# Patient Record
Sex: Male | Born: 1975 | Race: White | Hispanic: No | Marital: Married | Smoking: Current every day smoker
Health system: Southern US, Community
[De-identification: ages and names within clinical notes are randomized; demographics above are authoritative.]

## PROBLEM LIST (undated history)

## (undated) DIAGNOSIS — I10 Essential (primary) hypertension: Secondary | ICD-10-CM

---

## 1998-03-02 ENCOUNTER — Emergency Department (HOSPITAL_COMMUNITY): Admission: EM | Admit: 1998-03-02 | Discharge: 1998-03-02 | Payer: Self-pay | Admitting: Emergency Medicine

## 2002-06-26 ENCOUNTER — Emergency Department (HOSPITAL_COMMUNITY): Admission: EM | Admit: 2002-06-26 | Discharge: 2002-06-26 | Payer: Self-pay | Admitting: Emergency Medicine

## 2002-06-26 ENCOUNTER — Encounter: Payer: Self-pay | Admitting: Emergency Medicine

## 2004-09-13 ENCOUNTER — Emergency Department (HOSPITAL_COMMUNITY): Admission: EM | Admit: 2004-09-13 | Discharge: 2004-09-13 | Payer: Self-pay | Admitting: Emergency Medicine

## 2005-01-19 ENCOUNTER — Emergency Department (HOSPITAL_COMMUNITY): Admission: EM | Admit: 2005-01-19 | Discharge: 2005-01-19 | Payer: Self-pay | Admitting: Emergency Medicine

## 2008-09-02 ENCOUNTER — Ambulatory Visit: Payer: Self-pay | Admitting: Internal Medicine

## 2009-08-05 ENCOUNTER — Ambulatory Visit: Payer: Self-pay | Admitting: Internal Medicine

## 2010-01-16 ENCOUNTER — Ambulatory Visit: Payer: Self-pay | Admitting: Internal Medicine

## 2010-03-19 ENCOUNTER — Ambulatory Visit: Payer: Self-pay | Admitting: Internal Medicine

## 2010-07-24 ENCOUNTER — Ambulatory Visit
Admission: RE | Admit: 2010-07-24 | Discharge: 2010-07-24 | Payer: Self-pay | Source: Home / Self Care | Attending: Internal Medicine | Admitting: Internal Medicine

## 2011-02-01 ENCOUNTER — Telehealth: Payer: Self-pay | Admitting: Internal Medicine

## 2011-02-01 NOTE — Telephone Encounter (Signed)
Called pt and advised him to get an appt with [facility].  Pt voiced understanding.

## 2011-02-01 NOTE — Telephone Encounter (Signed)
Pt should get appt with [facility].

## 2011-06-21 ENCOUNTER — Ambulatory Visit (INDEPENDENT_AMBULATORY_CARE_PROVIDER_SITE_OTHER): Payer: BC Managed Care – PPO | Admitting: Internal Medicine

## 2011-06-21 ENCOUNTER — Encounter: Payer: Self-pay | Admitting: Internal Medicine

## 2011-06-21 DIAGNOSIS — I10 Essential (primary) hypertension: Secondary | ICD-10-CM

## 2011-06-21 DIAGNOSIS — K5289 Other specified noninfective gastroenteritis and colitis: Secondary | ICD-10-CM

## 2011-06-21 DIAGNOSIS — K529 Noninfective gastroenteritis and colitis, unspecified: Secondary | ICD-10-CM

## 2011-06-21 DIAGNOSIS — F411 Generalized anxiety disorder: Secondary | ICD-10-CM

## 2011-06-21 DIAGNOSIS — F419 Anxiety disorder, unspecified: Secondary | ICD-10-CM | POA: Insufficient documentation

## 2011-06-21 NOTE — Progress Notes (Signed)
  Subjective:    Patient ID: Barry Beltran, male    DOB: 1975/10/20, 35 y.o.   MRN: [id number]  HPI patient awakened yesterday around 5 AM with vomiting and diarrhea. Had 8 episodes of vomiting and about 14 episodes of diarrhea. No blood in stool. Today's had about 6 episodes of diarrhea. Denies myalgias. No fever or shaking chills. Has newborn infant at home in daycare who came home with fever and vomiting with respiratory congestion. Patient has no respiratory congestion symptoms. History of anxiety. Marriage is under fair amount of stress. Wife is unhappy about going back to work bleeding baby in daycare wants to quit her job. He says he cannot afford to do that financially. Blood pressure significantly elevated today at 150/120. Usually when he seen in the doctor's office diastolic is around 100. History of labile hypertension. Family history of hypertension in both parents. Admits to being under a lot of financial stress. He got his old job back recently about 6 months ago. Wife now wants him to find another job and wants a new house. They have had a frank discussion about how he does not feel that this is feasible. Also has a 22 year old son from a previous relationship that has been acting out with a new baby at home. He's done poorly in school. He wanted to know where his real mother wants. It had a frank discussion with him about mother being on cocaine and being bipolar. They think she is living in [place]. No complaint of sore throat.    Review of Systems     Objective:   Physical Exam lethargic white male. HEENT exam pharynx is injected without exudate. TMs are clear; neck is supple without significant adenopathy; chest clear; abdomen bowel sounds are active abdomen is nondistended no hepatosplenomegaly masses or significant tenderness        Assessment & Plan:  Viral gastroenteritis  Anxiety  Hypertension  Plan: Start Norvasc 5 mg daily and recheck blood pressure in 2 weeks;  Phenergan 25 mg Prevacid #30 (tablets 1 by mouth every 4 hours when necessary nausea with no refill. Clear liquids for 48 hours or so until the vomiting and diarrhea have resolved and then advance diet slowly. Xanax 0.5 mg which was previously prescribed for anxiety #60 one by mouth twice daily when necessary anxiety with 1 refill

## 2011-06-21 NOTE — Patient Instructions (Signed)
Take Xanax 0.5 mg twice daily as needed for anxiety. Take Phenergan 25 mg tablets every 4-6 hours as needed for nausea. Start Norvasc 5 mg daily for hypertension. Followup with blood pressure check in 2 weeks here. Stay on clear liquids for 24-48 hours and advance diet slowly. Out of work today and tomorrow note provided

## 2011-07-08 ENCOUNTER — Encounter: Payer: Self-pay | Admitting: Internal Medicine

## 2011-07-08 ENCOUNTER — Ambulatory Visit (INDEPENDENT_AMBULATORY_CARE_PROVIDER_SITE_OTHER): Payer: BC Managed Care – PPO | Admitting: Internal Medicine

## 2011-07-08 VITALS — BP 142/92 | HR 96 | Temp 97.9°F | Wt 213.0 lb

## 2011-07-08 DIAGNOSIS — Z23 Encounter for immunization: Secondary | ICD-10-CM

## 2011-07-08 DIAGNOSIS — I10 Essential (primary) hypertension: Secondary | ICD-10-CM

## 2011-07-08 DIAGNOSIS — F411 Generalized anxiety disorder: Secondary | ICD-10-CM

## 2011-07-08 DIAGNOSIS — M79659 Pain in unspecified thigh: Secondary | ICD-10-CM

## 2011-07-08 DIAGNOSIS — F419 Anxiety disorder, unspecified: Secondary | ICD-10-CM

## 2011-07-08 DIAGNOSIS — M79609 Pain in unspecified limb: Secondary | ICD-10-CM

## 2011-07-08 DIAGNOSIS — M79606 Pain in leg, unspecified: Secondary | ICD-10-CM

## 2011-07-08 MED ORDER — TETANUS-DIPHTH-ACELL PERTUSSIS 5-2.5-18.5 LF-MCG/0.5 IM SUSP: 0.5000 mL | Freq: Once | INTRAMUSCULAR | Status: AC

## 2011-07-08 NOTE — Patient Instructions (Signed)
Take ibuprofen 800 mg 3 times daily with food for 2 weeks. Ice leg down for 20 minutes daily. Start Cozaar 50 mg daily in addition to amlodipine 5 mg daily. You have been given immunization today for influenza. Continue to take Xanax sparingly for anxiety.

## 2011-07-08 NOTE — Progress Notes (Signed)
  Subjective:    Patient ID: Barry Beltran, male    DOB: 1975/11/17, 35 y.o.   MRN: [id number]  HPI in today to followup on hypertension. Currently on amlodipine 5 mg daily and tolerating it without any side effects. Blood pressure has improved but is not optimal. Also he slipped yesterday on a wet deck and strained his left 5. Having considerable pain in left upper medial thigh. Xanax has helped anxiety but he has to take 2 of the 0.5 mg tablets to have any affect. Says he's been taking Xanax sparingly over the holidays. 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    Review of Systems     Objective:   Physical Exam left medial thigh tender to deep palpation. No evidence of bruising. No hernia to direct palpation. Chest clear; cardiac exam regular rate and rhythm        Assessment & Plan:  Anxiety  Hypertension  Musculoskeletal pain/strain left thigh  Plan: Add Cozaar 50 mg daily to amlodipine 5 mg daily and recheck in 4 weeks with office visit in be met. Influenza and Tdap vaccines given today

## 2011-07-09 ENCOUNTER — Telehealth: Payer: Self-pay

## 2011-07-09 NOTE — Telephone Encounter (Signed)
This is only a strain. Has not been on Ibuprofen long enough to relieve the pain. Continue Ibuprofen as prescribed yesterday. Ice down for 20 minutes daily. It may take 10 days to improve.

## 2011-07-09 NOTE — Telephone Encounter (Signed)
Patient informed. 

## 2011-07-09 NOTE — Telephone Encounter (Signed)
Ibuprofen 800 mg not touching pain from groin pull.

## 2011-08-03 ENCOUNTER — Encounter: Payer: Self-pay | Admitting: Internal Medicine

## 2011-08-05 ENCOUNTER — Ambulatory Visit: Payer: BC Managed Care – PPO | Admitting: Internal Medicine

## 2011-08-18 ENCOUNTER — Other Ambulatory Visit: Payer: Self-pay | Admitting: Internal Medicine

## 2011-08-31 ENCOUNTER — Other Ambulatory Visit: Payer: Self-pay | Admitting: Internal Medicine

## 2011-09-30 ENCOUNTER — Other Ambulatory Visit: Payer: Self-pay

## 2011-09-30 DIAGNOSIS — F419 Anxiety disorder, unspecified: Secondary | ICD-10-CM

## 2011-09-30 MED ORDER — ALPRAZOLAM 0.5 MG PO TABS: 0.5000 mg | ORAL_TABLET | Freq: Two times a day (BID) | ORAL | Status: DC

## 2011-11-06 ENCOUNTER — Other Ambulatory Visit: Payer: Self-pay | Admitting: Internal Medicine

## 2011-12-07 ENCOUNTER — Encounter (HOSPITAL_COMMUNITY): Payer: Self-pay | Admitting: Emergency Medicine

## 2011-12-07 ENCOUNTER — Emergency Department (HOSPITAL_COMMUNITY): Payer: BC Managed Care – PPO

## 2011-12-07 ENCOUNTER — Emergency Department (HOSPITAL_COMMUNITY)
Admission: EM | Admit: 2011-12-07 | Discharge: 2011-12-07 | Disposition: A | Discharge status: Home / Self Care | Payer: BC Managed Care – PPO | Attending: Emergency Medicine | Admitting: Emergency Medicine

## 2011-12-07 DIAGNOSIS — S61509A Unspecified open wound of unspecified wrist, initial encounter: Secondary | ICD-10-CM | POA: Insufficient documentation

## 2011-12-07 DIAGNOSIS — W01119A Fall on same level from slipping, tripping and stumbling with subsequent striking against unspecified sharp object, initial encounter: Secondary | ICD-10-CM | POA: Insufficient documentation

## 2011-12-07 DIAGNOSIS — I10 Essential (primary) hypertension: Secondary | ICD-10-CM | POA: Insufficient documentation

## 2011-12-07 DIAGNOSIS — S63509A Unspecified sprain of unspecified wrist, initial encounter: Secondary | ICD-10-CM | POA: Insufficient documentation

## 2011-12-07 DIAGNOSIS — W268XXA Contact with other sharp object(s), not elsewhere classified, initial encounter: Secondary | ICD-10-CM | POA: Insufficient documentation

## 2011-12-07 DIAGNOSIS — S61519A Laceration without foreign body of unspecified wrist, initial encounter: Secondary | ICD-10-CM

## 2011-12-07 DIAGNOSIS — Y92009 Unspecified place in unspecified non-institutional (private) residence as the place of occurrence of the external cause: Secondary | ICD-10-CM | POA: Insufficient documentation

## 2011-12-07 HISTORY — DX: Essential (primary) hypertension: I10

## 2011-12-07 MED ORDER — OXYCODONE-ACETAMINOPHEN 5-325 MG PO TABS: 1.0000 | ORAL_TABLET | Freq: Four times a day (QID) | ORAL | Status: AC | PRN

## 2011-12-07 MED ORDER — KETOROLAC TROMETHAMINE 30 MG/ML IJ SOLN
30.0000 mg | Freq: Once | INTRAMUSCULAR | Status: AC
  Administered 2011-12-07: 30 mg via INTRAMUSCULAR
  Filled 2011-12-07: qty 1

## 2011-12-07 NOTE — ED Notes (Signed)
Pt presents with c/o wrist laceration. Fell at approximately 1045 tonight and scraped arm on metal screws. Pt sts wrist extremely painful. Can wiggle all digits, thumb and pinky hurt when trying to move. Wrist is extremely tender to touch. Laceration continues to bleed.

## 2011-12-07 NOTE — Discharge Instructions (Signed)
You were seen and evaluated for your left wrist injuries and laceration. X-rays show any broken bones. Your laceration was cleaned and repaired with sutures will need to be removed in 10 days. You may followup with your primary care provider, urgent care Center or return to the emergency room to have your sutures removed. Please watch for any signs of redness to the skin, increasing swelling, increasing pain, pus or discharge, fever or chills. If you develop any of these symptoms return to the emergency room immediately. Your providers also feel you have a sprained wrist. You have been placed in a splint to help keep your hand from moving and rest. He should continue to use rest, ice, compression and elevation to reduce pain and swelling.   Laceration Care, Adult A laceration is a cut or lesion that goes through all layers of the skin and into the tissue just beneath the skin. TREATMENT  Some lacerations may not require closure. Some lacerations may not be able to be closed due to an increased risk of infection. It is important to see your caregiver as soon as possible after an injury to minimize the risk of infection and maximize the opportunity for successful closure. If closure is appropriate, pain medicines may be given, if needed. The wound will be cleaned to help prevent infection. Your caregiver will use stitches (sutures), staples, wound glue (adhesive), or skin adhesive strips to repair the laceration. These tools bring the skin edges together to allow for faster healing and a better cosmetic outcome. However, all wounds will heal with a scar. Once the wound has healed, scarring can be minimized by covering the wound with sunscreen during the day for 1 full year. HOME CARE INSTRUCTIONS  For sutures or staples:  Keep the wound clean and dry.   If you were given a bandage (dressing), you should change it at least once a day. Also, change the dressing if it becomes wet or dirty, or as directed by  your caregiver.   Wash the wound with soap and water 2 times a day. Rinse the wound off with water to remove all soap. Pat the wound dry with a clean towel.   After cleaning, apply a thin layer of the antibiotic ointment as recommended by your caregiver. This will help prevent infection and keep the dressing from sticking.   You may shower as usual after the first 24 hours. Do not soak the wound in water until the sutures are removed.   Only take over-the-counter or prescription medicines for pain, discomfort, or fever as directed by your caregiver.   Get your sutures or staples removed as directed by your caregiver.  For skin adhesive strips:  Keep the wound clean and dry.   Do not get the skin adhesive strips wet. You may bathe carefully, using caution to keep the wound dry.   If the wound gets wet, pat it dry with a clean towel.   Skin adhesive strips will fall off on their own. You may trim the strips as the wound heals. Do not remove skin adhesive strips that are still stuck to the wound. They will fall off in time.  For wound adhesive:  You may briefly wet your wound in the shower or bath. Do not soak or scrub the wound. Do not swim. Avoid periods of heavy perspiration until the skin adhesive has fallen off on its own. After showering or bathing, gently pat the wound dry with a clean towel.   Do not  apply liquid medicine, cream medicine, or ointment medicine to your wound while the skin adhesive is in place. This may loosen the film before your wound is healed.   If a dressing is placed over the wound, be careful not to apply tape directly over the skin adhesive. This may cause the adhesive to be pulled off before the wound is healed.   Avoid prolonged exposure to sunlight or tanning lamps while the skin adhesive is in place. Exposure to ultraviolet light in the first year will darken the scar.   The skin adhesive will usually remain in place for 5 to 10 days, then naturally fall  off the skin. Do not pick at the adhesive film.  You may need a tetanus shot if:  You cannot remember when you had your last tetanus shot.   You have never had a tetanus shot.  If you get a tetanus shot, your arm may swell, get red, and feel warm to the touch. This is common and not a problem. If you need a tetanus shot and you choose not to have one, there is a rare chance of getting tetanus. Sickness from tetanus can be serious. SEEK MEDICAL CARE IF:   You have redness, swelling, or increasing pain in the wound.   You see a red line that goes away from the wound.   You have yellowish-white fluid (pus) coming from the wound.   You have a fever.   You notice a bad smell coming from the wound or dressing.   Your wound breaks open before or after sutures have been removed.   You notice something coming out of the wound such as wood or glass.   Your wound is on your hand or foot and you cannot move a finger or toe.  SEEK IMMEDIATE MEDICAL CARE IF:   Your pain is not controlled with prescribed medicine.   You have severe swelling around the wound causing pain and numbness or a change in color in your arm, hand, leg, or foot.   Your wound splits open and starts bleeding.   You have worsening numbness, weakness, or loss of function of any joint around or beyond the wound.   You develop painful lumps near the wound or on the skin anywhere on your body.  MAKE SURE YOU:   Understand these instructions.   Will watch your condition.   Will get help right away if you are not doing well or get worse.  Document Released: 06/28/2005 Document Revised: 06/17/2011 Document Reviewed: 12/22/2010 [facility] Patient Information 2012 [place], [place].      Sprains Sprains are painful injuries to joints as a result of partial or complete tearing of ligaments. HOME CARE INSTRUCTIONS   For the first 24 hours, keep the injured limb raised on 2 pillows while lying down.   Apply ice bags about  every 2 hours for 20 to 30 minutes, while awake, to the injured area for the first 24 hours. Then apply as directed by your caregiver. Place the ice in a plastic bag with a towel around it to prevent frostbite to the skin.   Only take over-the-counter or prescription medicines for pain, discomfort, or fever as directed by your caregiver.   If an ace bandage (a stretchy, elastic wrapping bandage) has been applied today, remove and reapply every 3 to 4 hours. Apply firm enough to keep swelling down. Donot apply tightly. Watch fingers or toes for swelling, bluish discoloration, coldness, numbness, or excessive pain. If any  of these problems (symptoms) occur, remove the ace bandage and reapply it more loosely. Contact your caregiver or return to this location if these symptoms persist.  Persistent pain and inability to use the injured area for more than 2 to 3 days are warning signs. See a caregiver for a follow-up visit as soon as possible. A hairline fracture (broken bone) may not show on X-rays. Persistent pain and swelling indicate that further evaluation, use of crutches, and/or more X-rays are needed. X-rays may sometimes not show a small fracture until a week or ten days later. Make a follow-up appointment with your own caregiver or to whom we have referred you. A specialist in reading X-rays(radiologist) will re-read your X-rays. Make sure you know how to obtain your X-ray results. Do not assume everything is normal if you do not hear from [place]. SEEK IMMEDIATE MEDICAL CARE IF:  You develop severe pain or more swelling.   The pain is not controlled with medicine.   Your skin or nails below the injury turn blue or grey or feel cold or numb.  Document Released: 06/25/2000 Document Revised: 06/17/2011 Document Reviewed: 02/12/2008 [facility] Patient Information 2012 [place], [place].

## 2011-12-07 NOTE — ED Notes (Signed)
Pt states he was taking out the trash and tripped over something and came down on some old carpet that had screws in it and caught his left wrist on something  Pt has a large laceration noted to his posterior wrist   Bleeding not controlled in triage  Compression dressing applied  Pt states he has pain radiating up to his elbow

## 2011-12-07 NOTE — ED Provider Notes (Signed)
History     CSN: [id number]  Arrival date & time 12/07/11  0004   First MD Initiated Contact with Patient 12/07/11 0151      Chief Complaint  Patient presents with  . Extremity Laceration    HPI  Hx provided by the pt.  Pt is a 36 yo male with PMH of HTN who presents after a fall with left wrist and arm injury.  Pt states that he was at home and tripped outside when taking out the trash over a pile of scrapes from a recent project.  He complains of left wrist pain and soreness with a laceration.  Pt believes he may have cut him selve on a metal screw.  There was associated bleeding that was controlled with pressure.  Pt also complains of increased pain in wrist area with movement or palpation.  Pain is better at rest.  Pt denies any numbness or weakness in wrist.  There are no other associated symptoms.  Pt denies head injury or LOC.  Pt reports having tetanus in the past year.    Past Medical History  Diagnosis Date  . Hypertension     History reviewed. No pertinent past surgical history.  Family History  Problem Relation Age of Onset  . Hypertension Mother     History  Substance Use Topics  . Smoking status: Current Everyday Smoker -- 0.8 packs/day    Types: Cigarettes  . Smokeless tobacco: Never Used  . Alcohol Use: Yes     rarely      Review of Systems  HENT: Negative for neck pain.   Musculoskeletal: Positive for joint swelling. Negative for back pain.  Neurological: Negative for weakness and numbness.    Allergies  Review of patient's allergies indicates no known allergies.  Home Medications   Current Outpatient Rx  Name Route Sig Dispense Refill  . ALPRAZOLAM 0.5 MG PO TABS Oral Take 1 tablet (0.5 mg total) by mouth 2 (two) times daily. 60 tablet 2  . AMLODIPINE BESYLATE 5 MG PO TABS  TAKE 1 TABLET BY MOUTH EVERY DAY FOR HYPERTENSION 30 tablet 1  . IBUPROFEN 800 MG PO TABS Oral Take 800 mg by mouth every 8 (eight) hours as needed.      Marland Kitchen LOSARTAN  POTASSIUM 50 MG PO TABS  TAKE 1 TABLET BY MOUTH EVERY DAY FOR HYPERTENSION 30 tablet 1    BP 156/87  Pulse 117  Temp(Src) 98.3 F (36.8 C) (Oral)  Resp 20  Wt 214 lb (97.07 kg)  SpO2 99%  Physical Exam  Nursing note and vitals reviewed. Constitutional: He is oriented to person, place, and time. He appears well-developed and well-nourished. No distress.  HENT:  Head: Normocephalic and atraumatic.  Neck: Normal range of motion. Neck supple.  Cardiovascular: Normal rate and regular rhythm.   Pulmonary/Chest: Effort normal and breath sounds normal.  Musculoskeletal:       5cm laceration to dorsal left distal forearm and wrist area.  No deep structure involvement.  Generalized swelling of wrist area.  Normal sensations and cap refill in fingers.  Reduced ROM in wrist and hand secondary to pain.    Neurological: He is alert and oriented to person, place, and time.  Psychiatric: He has a normal mood and affect. His behavior is normal.    ED Course  Procedures   LACERATION REPAIR Performed by: Angus Seller Authorized by: Angus Seller Consent: Verbal consent obtained. Risks and benefits: risks, benefits and alternatives were discussed Consent given by:  patient Patient identity confirmed: provided demographic data Prepped and Draped in normal sterile fashion Wound explored  Laceration Location: left forearm  Laceration Length: 5 cm  No Foreign Bodies seen or palpated  Anesthesia: local infiltration  Local anesthetic: lidocaine 1%  With epinephrine  Anesthetic total: 4 ml  Irrigation method: syringe Amount of cleaning: standard  Skin closure: Skin with 3-0 Nylon  Number of sutures: 8  Technique: Simple interrupted.  Patient tolerance: Patient tolerated the procedure well with no immediate complications.      Dg Wrist Complete Left  12/07/2011  *RADIOLOGY REPORT*  Clinical Data: Laceration with pain.  LEFT WRIST - COMPLETE 3+ VIEW  Comparison: None.   Findings: Mild dorsal soft tissue swelling.  No fracture or dislocation.  No erosive arthropathy. No radiopaque foreign body.  IMPRESSION: No acute osseous findings.  Original Report Authenticated By: Elsie Stain, M.D.     1. Laceration of wrist   2. Wrist sprain and strain       MDM  Pt seen and evaluated.  Pt in no acute distress.        Angus Seller, [place] 12/07/11 [phone]

## 2011-12-09 NOTE — ED Provider Notes (Signed)
Medical screening examination/treatment/procedure(s) were performed by non-physician practitioner and as supervising physician I was immediately available for consultation/collaboration.   Daana Petrasek, MD 12/09/11 1958 

## 2011-12-13 ENCOUNTER — Encounter: Payer: Self-pay | Admitting: Internal Medicine

## 2011-12-13 ENCOUNTER — Ambulatory Visit (INDEPENDENT_AMBULATORY_CARE_PROVIDER_SITE_OTHER): Payer: BC Managed Care – PPO | Admitting: Internal Medicine

## 2011-12-13 VITALS — BP 128/84 | Temp 98.7°F | Wt 208.0 lb

## 2011-12-13 DIAGNOSIS — S51809A Unspecified open wound of unspecified forearm, initial encounter: Secondary | ICD-10-CM

## 2011-12-13 DIAGNOSIS — S6992XA Unspecified injury of left wrist, hand and finger(s), initial encounter: Secondary | ICD-10-CM

## 2011-12-13 DIAGNOSIS — S51812A Laceration without foreign body of left forearm, initial encounter: Secondary | ICD-10-CM

## 2011-12-13 DIAGNOSIS — S6990XA Unspecified injury of unspecified wrist, hand and finger(s), initial encounter: Secondary | ICD-10-CM

## 2011-12-13 DIAGNOSIS — S59919A Unspecified injury of unspecified forearm, initial encounter: Secondary | ICD-10-CM

## 2011-12-17 ENCOUNTER — Ambulatory Visit: Payer: BC Managed Care – PPO | Admitting: Internal Medicine

## 2011-12-26 ENCOUNTER — Encounter: Payer: Self-pay | Admitting: Internal Medicine

## 2011-12-26 NOTE — Patient Instructions (Addendum)
Take antibiotics as prescribed. Take pain medications as prescribed. Appointment made to see hand surgeon.

## 2011-12-26 NOTE — Progress Notes (Signed)
  Subjective:    Patient ID: Barry Beltran, male    DOB: [birth year], 36 y.o.   MRN: [id number]  HPI 36 year old white male injured his hand at home recently. Patient was seen at emergency department on May 28. It seems that he tripped while he was taking the trash out. Thinks he may have cut himself with a metal screw. Patient had tetanus immunization December 2012 in this office. At that time he was seen in emergency department he was not complaining of numbness or weakness in the wrist. Laceration of left forearm 5 cm in length was closed with 8 sutures using 3-0 nylon. X-ray of left wrist showed mild dorsal soft tissue swelling without fracture or dislocation. Have her patient is having considerable pain and is complaining of decreased mobility in the left wrist with weakness nail. Pain is very intense. Has not been able to get much wrist.    Review of Systems     Objective:   Physical Exam wound was unwrapped and inspected. No drainage or increased warmth of laceration.Marland Kitchen However he is having considerable pain with mild palpation of wrist. Decreased range of motion left wrist.        Assessment & Plan:  Laceration left forearm  Intractable pain left forearm  Left wrist injury secondary to fall  Possible tendon injury  Plan: Patient will  placed on Keflex 500 mg 4 times daily. Will be given Tylox one by mouth every 6 hours when necessary pain. Appointment to see hand surgeon in the next 24-48 hours.

## 2011-12-28 ENCOUNTER — Other Ambulatory Visit: Payer: Self-pay

## 2011-12-28 DIAGNOSIS — F419 Anxiety disorder, unspecified: Secondary | ICD-10-CM

## 2011-12-28 MED ORDER — ALPRAZOLAM 0.5 MG PO TABS: 0.5000 mg | ORAL_TABLET | Freq: Two times a day (BID) | ORAL | Status: DC

## 2012-03-28 ENCOUNTER — Other Ambulatory Visit: Payer: Self-pay

## 2012-03-28 ENCOUNTER — Other Ambulatory Visit: Payer: Self-pay | Admitting: Internal Medicine

## 2012-03-28 DIAGNOSIS — F419 Anxiety disorder, unspecified: Secondary | ICD-10-CM

## 2012-03-28 MED ORDER — ALPRAZOLAM 0.5 MG PO TABS: 0.5000 mg | ORAL_TABLET | Freq: Two times a day (BID) | ORAL | Status: DC

## 2012-03-29 ENCOUNTER — Other Ambulatory Visit: Payer: Self-pay

## 2012-03-29 MED ORDER — AMLODIPINE BESYLATE 5 MG PO TABS: 5.0000 mg | ORAL_TABLET | Freq: Every day | ORAL | Status: DC

## 2012-03-29 MED ORDER — LOSARTAN POTASSIUM 50 MG PO TABS: 50.0000 mg | ORAL_TABLET | Freq: Every day | ORAL | Status: DC

## 2012-05-08 ENCOUNTER — Other Ambulatory Visit: Payer: Self-pay | Admitting: Internal Medicine

## 2012-05-08 NOTE — Telephone Encounter (Signed)
Refill Xanax once. Will need to come in for OV before can refill again. Policy is to see q 6 months if on benzodiazepines.

## 2012-05-09 ENCOUNTER — Other Ambulatory Visit: Payer: Self-pay

## 2012-05-09 DIAGNOSIS — F419 Anxiety disorder, unspecified: Secondary | ICD-10-CM

## 2012-05-09 MED ORDER — ALPRAZOLAM 0.5 MG PO TABS: 0.5000 mg | ORAL_TABLET | Freq: Two times a day (BID) | ORAL | Status: DC

## 2012-07-25 ENCOUNTER — Encounter: Payer: Self-pay | Admitting: Internal Medicine

## 2012-07-25 ENCOUNTER — Ambulatory Visit (INDEPENDENT_AMBULATORY_CARE_PROVIDER_SITE_OTHER): Payer: BC Managed Care – PPO | Admitting: Internal Medicine

## 2012-07-25 ENCOUNTER — Ambulatory Visit
Admission: RE | Admit: 2012-07-25 | Discharge: 2012-07-25 | Disposition: A | Discharge status: Home / Self Care | Payer: BC Managed Care – PPO | Source: Ambulatory Visit | Attending: Internal Medicine | Admitting: Internal Medicine

## 2012-07-25 VITALS — BP 150/96 | HR 100 | Temp 98.3°F | Wt 219.5 lb

## 2012-07-25 DIAGNOSIS — R079 Chest pain, unspecified: Secondary | ICD-10-CM

## 2012-07-25 DIAGNOSIS — F419 Anxiety disorder, unspecified: Secondary | ICD-10-CM

## 2012-07-25 DIAGNOSIS — F411 Generalized anxiety disorder: Secondary | ICD-10-CM

## 2012-07-25 DIAGNOSIS — R1011 Right upper quadrant pain: Secondary | ICD-10-CM

## 2012-07-25 DIAGNOSIS — R0781 Pleurodynia: Secondary | ICD-10-CM

## 2012-07-25 DIAGNOSIS — Z87891 Personal history of nicotine dependence: Secondary | ICD-10-CM

## 2012-07-25 LAB — CBC WITH DIFFERENTIAL/PLATELET
Basophils Absolute: 0.1 10*3/uL (ref 0.0–0.1)
Basophils Relative: 1 % (ref 0–1)
Eosinophils Absolute: 0.4 10*3/uL (ref 0.0–0.7)
Eosinophils Relative: 4 % (ref 0–5)
Lymphs Abs: 3.3 10*3/uL (ref 0.7–4.0)
MCH: 32.1 pg (ref 26.0–34.0)
MCV: 91.6 fL (ref 78.0–100.0)
Neutrophils Relative %: 56 % (ref 43–77)
Platelets: 180 10*3/uL (ref 150–400)
RBC: 4.77 MIL/uL (ref 4.22–5.81)
RDW: 13.8 % (ref 11.5–15.5)

## 2012-07-25 LAB — COMPREHENSIVE METABOLIC PANEL
ALT: 53 U/L (ref 0–53)
AST: 43 U/L — ABNORMAL HIGH (ref 0–37)
Alkaline Phosphatase: 84 U/L (ref 39–117)
Creat: 0.84 mg/dL (ref 0.50–1.35)
Sodium: 136 mEq/L (ref 135–145)
Total Bilirubin: 0.5 mg/dL (ref 0.3–1.2)
Total Protein: 6.8 g/dL (ref 6.0–8.3)

## 2012-07-25 MED ORDER — ALPRAZOLAM 0.5 MG PO TABS: 0.5000 mg | ORAL_TABLET | Freq: Two times a day (BID) | ORAL | Status: DC

## 2012-07-25 MED ORDER — HYDROCODONE-ACETAMINOPHEN 10-325 MG PO TABS: 1.0000 | ORAL_TABLET | Freq: Three times a day (TID) | ORAL | Status: DC | PRN

## 2012-07-25 NOTE — Addendum Note (Signed)
Addended by: Judy Pimple on: 07/25/2012 02:40 PM   Modules accepted: Orders

## 2012-07-25 NOTE — Progress Notes (Signed)
Patient informed. 
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
## 2012-07-25 NOTE — Patient Instructions (Addendum)
CBC C. met drawn. Obtain chest x-ray and right rib detail films today. Take Lorcet 10/650 twice daily as needed for pain up to 15 days. We will refill Xanax for 30 days.

## 2012-09-04 ENCOUNTER — Other Ambulatory Visit: Payer: Self-pay | Admitting: Internal Medicine

## 2012-09-04 NOTE — Telephone Encounter (Signed)
Refill Xanax for 30 days( #60) 0.05mg  last seen mid January and Xanax was refilled at that time.

## 2012-09-05 ENCOUNTER — Other Ambulatory Visit: Payer: Self-pay

## 2012-09-05 ENCOUNTER — Other Ambulatory Visit: Payer: Self-pay | Admitting: Internal Medicine

## 2012-09-05 NOTE — Telephone Encounter (Signed)
I believe this was handled yesterday. Please check.

## 2012-09-06 ENCOUNTER — Other Ambulatory Visit: Payer: Self-pay | Admitting: Internal Medicine

## 2012-10-05 ENCOUNTER — Other Ambulatory Visit: Payer: Self-pay | Admitting: Internal Medicine

## 2012-10-11 ENCOUNTER — Other Ambulatory Visit: Payer: Self-pay

## 2012-11-09 ENCOUNTER — Other Ambulatory Visit: Payer: Self-pay

## 2012-11-09 ENCOUNTER — Other Ambulatory Visit: Payer: Self-pay | Admitting: Internal Medicine

## 2012-11-09 MED ORDER — ALPRAZOLAM 0.5 MG PO TABS: 0.5000 mg | ORAL_TABLET | Freq: Two times a day (BID) | ORAL | Status: DC | PRN

## 2012-11-09 NOTE — Telephone Encounter (Signed)
Refill  One time only #60

## 2012-11-17 ENCOUNTER — Encounter: Payer: Self-pay | Admitting: Internal Medicine

## 2012-11-17 ENCOUNTER — Ambulatory Visit (INDEPENDENT_AMBULATORY_CARE_PROVIDER_SITE_OTHER): Payer: BC Managed Care – PPO | Admitting: Internal Medicine

## 2012-11-17 VITALS — BP 136/68 | HR 80 | Temp 99.0°F

## 2012-11-17 DIAGNOSIS — H6690 Otitis media, unspecified, unspecified ear: Secondary | ICD-10-CM

## 2012-11-17 DIAGNOSIS — R51 Headache: Secondary | ICD-10-CM

## 2012-11-17 DIAGNOSIS — H6691 Otitis media, unspecified, right ear: Secondary | ICD-10-CM

## 2012-11-17 DIAGNOSIS — J019 Acute sinusitis, unspecified: Secondary | ICD-10-CM

## 2012-11-17 MED ORDER — METHYLPREDNISOLONE ACETATE 80 MG/ML IJ SUSP
80.0000 mg | Freq: Once | INTRAMUSCULAR | Status: AC
  Administered 2012-11-17: 80 mg via INTRAMUSCULAR

## 2012-11-17 MED ORDER — CEFTRIAXONE SODIUM 1 G IJ SOLR
1.0000 g | Freq: Once | INTRAMUSCULAR | Status: AC
  Administered 2012-11-17: 1 g via INTRAMUSCULAR

## 2012-11-17 NOTE — Patient Instructions (Addendum)
Take Levaquin 500 milligrams daily for 10 days. He had been given 1 g IM Rocephin and 80 mg IM Depo-Medrol. Take Norco 10/325 sparingly for headache. Return in 2 weeks for ear check.

## 2012-11-17 NOTE — Addendum Note (Signed)
Addended by: Judy Pimple on: 11/17/2012 05:07 PM   Modules accepted: Orders

## 2012-11-17 NOTE — Progress Notes (Signed)
  Subjective:    Patient ID: Barry Beltran, male    DOB: May 04, 1976, 37 y.o.   MRN: [id number]  HPI 37 year old White male seen at [facility] on [address] Sunday, May 3rd.  for URI symptoms   and was treated with Augmentin 875 mg bid and Flonase.   2 days later was seen at Urgent Care in [place] treated with Tramadol for headache and antipyrine with benzocaine otic solution. Main complaint is headache and ear pain. No sore throat now. Had slight sore throat Sunday, May 3rd. Has been out of work all week. Says right ear hurts worse than left year. Has right parietal headache. Complains of discolored sputum production. No documented fever. Has discolored nasal drainage. Has malaise and fatigue. Tramadol did not help headache. Patient says he needs to get back to work next week because he has a large project due. Complaining of nasal congestion and difficulty breathing through his nose. Says son is had strep throat and wife had similar illness that responded to a Z-Pak.    Review of Systems     Objective:   Physical Exam Skin is warm and dry. Nodes none. HEENT exam: Right TM is red and dull. Left TM is full slightly was splayed light reflex but not red. Pharynx slightly injected without exudate. Neck is supple without significant adenopathy. Chest is clear to auscultation.        Assessment & Plan:  Right acute otitis media  Sinusitis  Headache  Plan: Norco 10/325 (#40) 1 by mouth every 6 hours when necessary headache. Depo-Medrol 80 mg IM. Rocephin 1 g IM. Levaquin 500 milligrams daily for 10 days. Note to be out of work today at his request. Patient should return in 2 weeks for ear check.

## 2012-12-20 ENCOUNTER — Other Ambulatory Visit: Payer: Self-pay | Admitting: Internal Medicine

## 2012-12-21 ENCOUNTER — Telehealth: Payer: Self-pay

## 2012-12-21 ENCOUNTER — Other Ambulatory Visit: Payer: Self-pay

## 2012-12-21 NOTE — Telephone Encounter (Signed)
Authorized per Dr. Lenord Fellers Xanax 0.5 mg; 1 tablet twice daily PRN; dispense 60 1 refill.KW

## 2013-01-18 ENCOUNTER — Other Ambulatory Visit: Payer: Self-pay | Admitting: Internal Medicine

## 2013-02-02 DIAGNOSIS — S22009A Unspecified fracture of unspecified thoracic vertebra, initial encounter for closed fracture: Secondary | ICD-10-CM | POA: Insufficient documentation

## 2013-03-05 ENCOUNTER — Other Ambulatory Visit: Payer: Self-pay | Admitting: Internal Medicine

## 2013-03-05 NOTE — Telephone Encounter (Signed)
Spoke with patient about Xanax use. He is agreeable to seeing a counselor, if needed. Told him we would give him some names. Refilled Xanax #60/0refills to last 60 days

## 2013-03-05 NOTE — Telephone Encounter (Signed)
Pt seems to be taking 2 tabs daily regularly. Would ask that he cut down on this. Refill once #60 tabs needs to make do with this x 2 months. Does he need to see counselor for stress?

## 2013-03-13 ENCOUNTER — Ambulatory Visit
Admission: RE | Admit: 2013-03-13 | Discharge: 2013-03-13 | Disposition: A | Discharge status: Home / Self Care | Payer: BC Managed Care – PPO | Source: Ambulatory Visit | Attending: Internal Medicine | Admitting: Internal Medicine

## 2013-03-13 ENCOUNTER — Ambulatory Visit (INDEPENDENT_AMBULATORY_CARE_PROVIDER_SITE_OTHER): Payer: BC Managed Care – PPO | Admitting: Internal Medicine

## 2013-03-13 VITALS — BP 136/90 | Temp 98.5°F | Wt 217.0 lb

## 2013-03-13 DIAGNOSIS — S161XXA Strain of muscle, fascia and tendon at neck level, initial encounter: Secondary | ICD-10-CM

## 2013-03-13 DIAGNOSIS — M25519 Pain in unspecified shoulder: Secondary | ICD-10-CM

## 2013-03-13 DIAGNOSIS — M25512 Pain in left shoulder: Secondary | ICD-10-CM
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
## 2013-03-14 NOTE — Progress Notes (Signed)
Patient informed. 

## 2013-04-11 ENCOUNTER — Encounter: Payer: Self-pay | Admitting: Internal Medicine

## 2013-04-11 NOTE — Patient Instructions (Signed)
Take pain medication and muscle relaxants sparingly. Take Sterapred DS 10 mg 6 day dosepak for musculoskeletal pain. Left shoulder x-ray ordered

## 2013-05-16 ENCOUNTER — Telehealth: Payer: Self-pay | Admitting: Internal Medicine

## 2013-05-16 NOTE — Telephone Encounter (Signed)
Employment is requiring him to go out-of-town for 3 days very soon. He has to Xanax tablets left. Last refilled August  2514 and was told to last 60 days. Says he has cut back on Xanax consumption. Has moved to [place]. Wants Rx called in to [facility] but there are 2 [facility] in [place] and he does not know which one he wants. He is to call back with correct info before we refill.

## 2013-05-17 ENCOUNTER — Telehealth: Payer: Self-pay | Admitting: Internal Medicine

## 2013-05-17 NOTE — Telephone Encounter (Signed)
1 Xanax refill called to [facility] [place] phone [phone]. Call in Xanax 0.5 mg #60 one by mouth twice daily with 1 refill.

## 2013-05-25 ENCOUNTER — Other Ambulatory Visit: Payer: Self-pay | Admitting: *Deleted

## 2013-05-25 MED ORDER — ALPRAZOLAM 0.5 MG PO TABS: 0.5000 mg | ORAL_TABLET | Freq: Two times a day (BID) | ORAL | Status: DC | PRN

## 2013-07-30 ENCOUNTER — Other Ambulatory Visit: Payer: Self-pay

## 2013-07-30 MED ORDER — ALPRAZOLAM 0.5 MG PO TABS: 0.5000 mg | ORAL_TABLET | Freq: Two times a day (BID) | ORAL | Status: DC | PRN

## 2013-10-10 DIAGNOSIS — M79609 Pain in unspecified limb: Secondary | ICD-10-CM | POA: Insufficient documentation

## 2014-04-17 ENCOUNTER — Ambulatory Visit: Payer: BC Managed Care – PPO | Admitting: Physical Therapy

## 2014-04-30 ENCOUNTER — Ambulatory Visit: Payer: BC Managed Care – PPO | Admitting: Physical Therapy

## 2014-05-07 ENCOUNTER — Ambulatory Visit: Payer: BC Managed Care – PPO | Admitting: Physical Therapy

## 2014-05-13 ENCOUNTER — Ambulatory Visit: Payer: BC Managed Care – PPO | Attending: Orthopedic Surgery | Admitting: Physical Therapy

## 2014-05-13 DIAGNOSIS — Z5189 Encounter for other specified aftercare: Secondary | ICD-10-CM | POA: Diagnosis present

## 2014-05-13 DIAGNOSIS — M25671 Stiffness of right ankle, not elsewhere classified: Secondary | ICD-10-CM | POA: Diagnosis not present

## 2014-05-13 DIAGNOSIS — M7661 Achilles tendinitis, right leg: Secondary | ICD-10-CM | POA: Insufficient documentation

## 2014-05-13 DIAGNOSIS — M25571 Pain in right ankle and joints of right foot: Secondary | ICD-10-CM | POA: Insufficient documentation

## 2014-05-13 DIAGNOSIS — M6281 Muscle weakness (generalized): Secondary | ICD-10-CM | POA: Insufficient documentation

## 2014-05-13 DIAGNOSIS — Z981 Arthrodesis status: Secondary | ICD-10-CM | POA: Insufficient documentation

## 2014-05-13 DIAGNOSIS — I10 Essential (primary) hypertension: Secondary | ICD-10-CM | POA: Insufficient documentation

## 2014-05-15 ENCOUNTER — Encounter: Payer: BC Managed Care – PPO | Admitting: Rehabilitation

## 2014-05-17 ENCOUNTER — Ambulatory Visit: Payer: BC Managed Care – PPO | Admitting: Physical Therapy

## 2014-05-17 DIAGNOSIS — Z5189 Encounter for other specified aftercare: Secondary | ICD-10-CM | POA: Diagnosis not present

## 2014-05-21 ENCOUNTER — Ambulatory Visit: Payer: BC Managed Care – PPO | Admitting: Rehabilitation

## 2014-05-21 DIAGNOSIS — Z5189 Encounter for other specified aftercare: Secondary | ICD-10-CM | POA: Diagnosis not present

## 2014-05-22 ENCOUNTER — Ambulatory Visit: Payer: BC Managed Care – PPO | Admitting: Physical Therapy

## 2014-05-22 DIAGNOSIS — Z5189 Encounter for other specified aftercare: Secondary | ICD-10-CM | POA: Diagnosis not present

## 2014-05-24 ENCOUNTER — Ambulatory Visit: Payer: BC Managed Care – PPO | Admitting: Physical Therapy

## 2014-05-24 DIAGNOSIS — Z5189 Encounter for other specified aftercare: Secondary | ICD-10-CM | POA: Diagnosis not present

## 2014-05-28 ENCOUNTER — Ambulatory Visit: Payer: BC Managed Care – PPO | Admitting: Physical Therapy

## 2014-05-28 DIAGNOSIS — Z5189 Encounter for other specified aftercare: Secondary | ICD-10-CM | POA: Diagnosis not present

## 2014-05-31 ENCOUNTER — Ambulatory Visit: Payer: BC Managed Care – PPO | Admitting: Rehabilitation

## 2014-05-31 DIAGNOSIS — Z5189 Encounter for other specified aftercare: Secondary | ICD-10-CM | POA: Diagnosis not present

## 2014-06-03 ENCOUNTER — Ambulatory Visit: Payer: BC Managed Care – PPO | Admitting: Rehabilitation

## 2014-06-03 DIAGNOSIS — Z5189 Encounter for other specified aftercare: Secondary | ICD-10-CM | POA: Diagnosis not present

## 2014-06-04 DIAGNOSIS — F17210 Nicotine dependence, cigarettes, uncomplicated: Secondary | ICD-10-CM | POA: Insufficient documentation

## 2014-06-04 DIAGNOSIS — E785 Hyperlipidemia, unspecified: Secondary | ICD-10-CM | POA: Insufficient documentation

## 2014-06-05 ENCOUNTER — Ambulatory Visit: Payer: BC Managed Care – PPO | Admitting: Physical Therapy

## 2014-06-05 DIAGNOSIS — Z5189 Encounter for other specified aftercare: Secondary | ICD-10-CM | POA: Diagnosis not present

## 2014-06-10 ENCOUNTER — Ambulatory Visit: Payer: BC Managed Care – PPO | Admitting: Rehabilitation

## 2014-06-13 ENCOUNTER — Ambulatory Visit: Payer: BC Managed Care – PPO | Attending: Orthopedic Surgery | Admitting: Physical Therapy

## 2014-06-13 DIAGNOSIS — Z5189 Encounter for other specified aftercare: Secondary | ICD-10-CM | POA: Insufficient documentation

## 2014-06-13 DIAGNOSIS — M25571 Pain in right ankle and joints of right foot: Secondary | ICD-10-CM | POA: Diagnosis not present

## 2014-06-13 DIAGNOSIS — Z981 Arthrodesis status: Secondary | ICD-10-CM | POA: Insufficient documentation

## 2014-06-13 DIAGNOSIS — M6281 Muscle weakness (generalized): Secondary | ICD-10-CM | POA: Diagnosis not present

## 2014-06-13 DIAGNOSIS — I10 Essential (primary) hypertension: Secondary | ICD-10-CM | POA: Insufficient documentation

## 2014-06-13 DIAGNOSIS — M7661 Achilles tendinitis, right leg: Secondary | ICD-10-CM | POA: Insufficient documentation

## 2014-06-13 DIAGNOSIS — M25671 Stiffness of right ankle, not elsewhere classified: Secondary | ICD-10-CM | POA: Insufficient documentation

## 2014-06-17 ENCOUNTER — Ambulatory Visit: Payer: BC Managed Care – PPO | Admitting: Rehabilitation

## 2014-06-17 DIAGNOSIS — Z5189 Encounter for other specified aftercare: Secondary | ICD-10-CM | POA: Diagnosis not present

## 2014-06-20 ENCOUNTER — Ambulatory Visit: Payer: BC Managed Care – PPO | Admitting: Physical Therapy

## 2014-12-17 DIAGNOSIS — K219 Gastro-esophageal reflux disease without esophagitis: Secondary | ICD-10-CM | POA: Insufficient documentation

## 2015-06-11 DIAGNOSIS — F3342 Major depressive disorder, recurrent, in full remission: Secondary | ICD-10-CM | POA: Insufficient documentation

## 2016-12-09 DIAGNOSIS — G8929 Other chronic pain: Secondary | ICD-10-CM | POA: Insufficient documentation

## 2017-07-11 DIAGNOSIS — Z96661 Presence of right artificial ankle joint: Secondary | ICD-10-CM | POA: Insufficient documentation

## 2017-08-17 DIAGNOSIS — E663 Overweight: Secondary | ICD-10-CM | POA: Insufficient documentation

## 2017-08-17 DIAGNOSIS — M1711 Unilateral primary osteoarthritis, right knee: Secondary | ICD-10-CM | POA: Insufficient documentation

## 2017-09-30 DIAGNOSIS — G4733 Obstructive sleep apnea (adult) (pediatric): Secondary | ICD-10-CM | POA: Insufficient documentation

## 2018-11-08 DIAGNOSIS — F32A Depression, unspecified: Secondary | ICD-10-CM | POA: Insufficient documentation

## 2019-01-02 DIAGNOSIS — J189 Pneumonia, unspecified organism: Secondary | ICD-10-CM | POA: Insufficient documentation

## 2019-01-03 DIAGNOSIS — J432 Centrilobular emphysema: Secondary | ICD-10-CM | POA: Insufficient documentation

## 2019-01-03 DIAGNOSIS — F1010 Alcohol abuse, uncomplicated: Secondary | ICD-10-CM | POA: Insufficient documentation

## 2019-01-04 DIAGNOSIS — A419 Sepsis, unspecified organism: Secondary | ICD-10-CM | POA: Insufficient documentation

## 2019-04-16 DIAGNOSIS — R1030 Lower abdominal pain, unspecified: Secondary | ICD-10-CM | POA: Insufficient documentation

## 2019-06-19 DIAGNOSIS — M8700 Idiopathic aseptic necrosis of unspecified bone: Secondary | ICD-10-CM | POA: Insufficient documentation

## 2019-08-06 DIAGNOSIS — Z4789 Encounter for other orthopedic aftercare: Secondary | ICD-10-CM | POA: Insufficient documentation

## 2021-04-08 DIAGNOSIS — M5416 Radiculopathy, lumbar region: Secondary | ICD-10-CM | POA: Insufficient documentation

## 2021-10-08 ENCOUNTER — Encounter: Payer: Self-pay | Admitting: Podiatry

## 2021-10-08 ENCOUNTER — Ambulatory Visit (INDEPENDENT_AMBULATORY_CARE_PROVIDER_SITE_OTHER): Payer: Medicare Other | Admitting: Podiatry

## 2021-10-08 ENCOUNTER — Ambulatory Visit (INDEPENDENT_AMBULATORY_CARE_PROVIDER_SITE_OTHER): Payer: Medicare Other

## 2021-10-08 DIAGNOSIS — M2031 Hallux varus (acquired), right foot: Secondary | ICD-10-CM | POA: Diagnosis not present

## 2021-10-08 DIAGNOSIS — M2061 Acquired deformities of toe(s), unspecified, right foot: Secondary | ICD-10-CM

## 2021-10-08 NOTE — Addendum Note (Signed)
Addended by: Hadley Pen R on: 10/08/2021 11:06 AM ? ? Modules accepted: Orders ? ?

## 2021-10-08 NOTE — Addendum Note (Signed)
Addended by: Hadley Pen R on: 10/08/2021 11:32 AM ? ? Modules accepted: Orders ? ?

## 2021-10-08 NOTE — Progress Notes (Signed)
?  Subjective:  ?Patient ID: Barry Beltran, male    DOB: 1976/05/26,   MRN: [id number] ? ?No chief complaint on file. ? ? ?46 y.o. male presents for concern of right great toe. Concerned that the nail is dead and has been pulling out of skin. Relates it hurts to be in shoes and hurts to bend the toe. Relates surgery three times on his right foot in the past and in his right ankle. Relates he believes he needs surgery on this big toe. Denies any other pedal complaints. Denies n/v/f/c.  ? ?Past Medical History:  ?Diagnosis Date  ? Hypertension   ? ? ?Objective:  ?Physical Exam: ?Vascular: DP/PT pulses 2/4 bilateral. CFT <3 seconds. Normal hair growth on digits. No edema.  ?Skin. No lacerations or abrasions bilateral feet.  ?Musculoskeletal: MMT 5/5 bilateral lower extremities in DF, PF, Inversion and Eversion. Deceased ROM in DF of ankle joint. Tender to palpation of right hallux IPJ. Pain with ROM of the joint. Also has pain with pressure to the hallux nail. Hallux malleus noted. Lesser digits with prior surgery rectus position.  ?Neurological: Sensation intact to light touch.  ? ?Assessment:  ? ?1. Acquired deformity of right toe   ?2. Hallux hammertoe, right   ? ? ? ?Plan:  ?Patient was evaluated and treated and all questions answered. ?-Xrays reviewed. Degenerative changes noted to hallux IPJ and hallux malleolus deformity noted. Retained hardware to lesser toes and ankle.  ?-Discussed hallux arthritis and deformity and  treatement options; conservative and  Surgical management; risks, benefits, alternatives discussed. All patient's questions answered. ?-Patient has been dealing with this chronically and would like to discuss surgery to correct the toe.  ?-Informed surgical risk consent was reviewed and read aloud to the patient.  I reviewed the films.  I have discussed my findings with the patient in great detail.  I have discussed all risks including but not limited to infection, stiffness, scarring, limp,  disability, deformity, damage to blood vessels and nerves, numbness, poor healing, need for braces, arthritis, chronic pain, amputation, death.  All benefits and realistic expectations discussed in great detail.  I have made no promises as to the outcome.  I have provided realistic expectations.  I have offered the patient a 2nd opinion, which they have declined and assured me they preferred to proceed despite the risks. ?-Will plan for hallux IPJ fusion and removal of hallux nail late April.  ?  ? ? ?Louann Sjogren, DPM  ? ? ?

## 2021-10-12 ENCOUNTER — Telehealth: Payer: Self-pay | Admitting: Urology

## 2021-10-12 NOTE — Telephone Encounter (Signed)
ERROR

## 2021-10-27 ENCOUNTER — Other Ambulatory Visit: Payer: Self-pay | Admitting: Podiatry

## 2021-10-27 ENCOUNTER — Encounter: Payer: Self-pay | Admitting: Podiatry

## 2021-10-27 ENCOUNTER — Telehealth: Payer: Self-pay | Admitting: Podiatry

## 2021-10-27 DIAGNOSIS — M2031 Hallux varus (acquired), right foot: Secondary | ICD-10-CM | POA: Diagnosis not present

## 2021-10-27 DIAGNOSIS — L600 Ingrowing nail: Secondary | ICD-10-CM | POA: Diagnosis not present

## 2021-10-27 MED ORDER — OXYCODONE HCL 5 MG PO CAPS: 5.0000 mg | ORAL_CAPSULE | ORAL | 0 refills | Status: DC | PRN

## 2021-10-27 MED ORDER — ONDANSETRON HCL 4 MG PO TABS: 4.0000 mg | ORAL_TABLET | Freq: Three times a day (TID) | ORAL | 0 refills | Status: DC | PRN

## 2021-10-27 MED ORDER — CEPHALEXIN 500 MG PO CAPS: 500.0000 mg | ORAL_CAPSULE | Freq: Four times a day (QID) | ORAL | 0 refills | Status: AC

## 2021-10-27 MED ORDER — CEPHALEXIN 500 MG PO CAPS: 500.0000 mg | ORAL_CAPSULE | Freq: Four times a day (QID) | ORAL | 0 refills | Status: DC

## 2021-10-27 NOTE — Telephone Encounter (Signed)
Sent to new pharmacy

## 2021-10-27 NOTE — Telephone Encounter (Signed)
I called the 2 pharmacies and had the prescriptions cancelled today so that patient can get his prescriptions filled at [facility]. His surgery is today at noon. ?  ?I have also notified patient of this and  that his 3 prescriptions can be picked up today. ?

## 2021-10-27 NOTE — Telephone Encounter (Signed)
Pt called stating that the medication that was sent in for him today prior to surgery was sent to the wrong pharmacy. [facility] [address] [facility] [place]. He is scheduled to have surgery today @ 1230 and would like it done before his surgery. ?

## 2021-10-27 NOTE — Telephone Encounter (Signed)
Pt notified..he said thank you! ?

## 2021-11-02 ENCOUNTER — Telehealth: Payer: Self-pay | Admitting: Podiatry

## 2021-11-02 ENCOUNTER — Other Ambulatory Visit: Payer: Self-pay | Admitting: Podiatry

## 2021-11-02 MED ORDER — OXYCODONE HCL 5 MG PO CAPS: 5.0000 mg | ORAL_CAPSULE | Freq: Four times a day (QID) | ORAL | 0 refills | Status: AC | PRN

## 2021-11-02 NOTE — Telephone Encounter (Signed)
Pt need medication refilled... ? ?oxycodone (OXY-IR) 5 MG capsule ? ?[id number] [place] [place] [address] ? ?Pt states he is taking the ibuprofen but its not helping. Pt had SX on 10/27/21. ?

## 2021-11-04 ENCOUNTER — Telehealth: Payer: Self-pay | Admitting: Podiatry

## 2021-11-04 NOTE — Telephone Encounter (Signed)
Pt called because he had a voicemail stating his appt was at 115 and to go to xray first. ?He is scheduled for 1015 and I told him to go to xray first and he said he would try but his ride is not going to pick him up until 1000ish. He said someone should have called him before this afternoon and he may have been able to work it out better. ?

## 2021-11-05 ENCOUNTER — Ambulatory Visit (INDEPENDENT_AMBULATORY_CARE_PROVIDER_SITE_OTHER): Payer: Medicare Other | Admitting: Podiatry

## 2021-11-05 ENCOUNTER — Encounter: Payer: Self-pay | Admitting: Podiatry

## 2021-11-05 ENCOUNTER — Ambulatory Visit (INDEPENDENT_AMBULATORY_CARE_PROVIDER_SITE_OTHER): Payer: Medicare Other

## 2021-11-05 DIAGNOSIS — Z9889 Other specified postprocedural states: Secondary | ICD-10-CM | POA: Diagnosis not present

## 2021-11-05 DIAGNOSIS — T81509A Unspecified complication of foreign body accidentally left in body following unspecified procedure, initial encounter: Secondary | ICD-10-CM

## 2021-11-05 NOTE — Progress Notes (Signed)
?  Subjective:  ?Patient ID: Barry Beltran, male    DOB: 12-10-1975,  MRN: [id number] ? ?No chief complaint on file. ? ? ?DOS: 10/27/21 ?Procedure: Right hallux IPJ fusion and total avulsion of right hallux nail permanent.  ? ?46 y.o. male returns for POV#1. Doing well. Has been managing with pain medicine. Relates he had to redress dressing because it was too tight.  ? ?Review of Systems: Negative except as noted in the HPI. Denies N/V/F/Ch. ? ?Past Medical History:  ?Diagnosis Date  ? Hypertension   ? ? ?Current Outpatient Medications:  ?  amLODipine (NORVASC) 5 MG tablet, TAKE 1 TABLET (5 MG TOTAL) BY MOUTH DAILY., Disp: 30 tablet, Rfl: 5 ?  atorvastatin (LIPITOR) 20 MG tablet, 1 tablet Orally Once a day for 30 day(s), Disp: , Rfl:  ?  celecoxib (CELEBREX) 200 MG capsule, Take by mouth., Disp: , Rfl:  ?  cyclobenzaprine (FLEXERIL) 5 MG tablet, TAKE 1 TABLET TWICE A DAY AS NEEDED MUSCLE SPASMS, Disp: , Rfl:  ?  gabapentin (NEURONTIN) 300 MG capsule, 1 capsule Orally Once a day for 30 day(s), Disp: , Rfl:  ?  meloxicam (MOBIC) 15 MG tablet, 1 tablet Orally Once a day for 30 day(s), Disp: , Rfl:  ?  methocarbamol (ROBAXIN) 750 MG tablet, 1 tablet Orally every 6 hrs for 30 day(s), Disp: , Rfl:  ?  montelukast (SINGULAIR) 10 MG tablet, 1 tablet Orally Once a day for 30 day(s), Disp: , Rfl:  ?  ondansetron (ZOFRAN) 4 MG tablet, Take 1 tablet (4 mg total) by mouth every 8 (eight) hours as needed for nausea or vomiting., Disp: 20 tablet, Rfl: 0 ?  oxycodone (OXY-IR) 5 MG capsule, Take 1 capsule (5 mg total) by mouth every 6 (six) hours as needed for up to 5 days., Disp: 20 capsule, Rfl: 0 ?  traZODone (DESYREL) 150 MG tablet, Take by mouth., Disp: , Rfl:  ? ?Current Facility-Administered Medications:  ?  TDaP (BOOSTRIX) injection 0.5 mL, 0.5 mL, Intramuscular, Once, Baxley, Luanna Cole, MD ? ?Social History  ? ?Tobacco Use  ?Smoking Status Every Day  ? Packs/day: 0.80  ? Types: Cigarettes  ?Smokeless Tobacco Never  ? ? ?No  Known Allergies ?Objective:  ?There were no vitals filed for this visit. ?There is no height or weight on file to calculate BMI. ?Constitutional Well developed. ?Well nourished.  ?Vascular Foot warm and well perfused. ?Capillary refill normal to all digits.   ?Neurologic Normal speech. ?Oriented to person, place, and time. ?Epicritic sensation to light touch grossly present bilaterally.  ?Dermatologic Skin healing well without signs of infection. Skin edges well coapted without signs of infection. Nail bed healing well as well.   ?Orthopedic: Tenderness to palpation noted about the surgical site.  ? ?Radiographs: Hardware intact and toe well aligned.  ?Assessment:  ? ?1. Post-operative state   ? ?Plan:  ?Patient was evaluated and treated and all questions answered. ? ?S/p foot surgery right ?-Progressing as expected post-operatively. ?-WB Status: WBAT in surgical shoe ?-Sutures: intact. ?-Medications: n/a ?-Foot redressed. ?Follow-up in 2 weeks for suture removal.  ? ?No follow-ups on file.  ? ?

## 2021-11-12 ENCOUNTER — Telehealth: Payer: Self-pay | Admitting: *Deleted

## 2021-11-12 ENCOUNTER — Other Ambulatory Visit: Payer: Self-pay | Admitting: Podiatry

## 2021-11-12 MED ORDER — OXYCODONE HCL 5 MG PO TABS: 5.0000 mg | ORAL_TABLET | ORAL | 0 refills | Status: DC | PRN

## 2021-11-12 NOTE — Telephone Encounter (Signed)
Patient is requesting a refill of pain medicine(Oxycodone-5 mg),pain is really bad. Please advise. ?

## 2021-11-19 ENCOUNTER — Ambulatory Visit (INDEPENDENT_AMBULATORY_CARE_PROVIDER_SITE_OTHER): Payer: Medicare Other | Admitting: Podiatry

## 2021-11-19 ENCOUNTER — Encounter: Payer: Medicare Other | Admitting: Podiatry

## 2021-11-19 DIAGNOSIS — M2061 Acquired deformities of toe(s), unspecified, right foot: Secondary | ICD-10-CM

## 2021-11-19 DIAGNOSIS — Z9889 Other specified postprocedural states: Secondary | ICD-10-CM

## 2021-11-22 ENCOUNTER — Encounter: Payer: Self-pay | Admitting: Podiatry

## 2021-11-22 NOTE — Progress Notes (Signed)
?  Subjective:  ?Patient ID: Barry Beltran, male    DOB: 1976/04/14,  MRN: [id number] ? ?Chief Complaint  ?Patient presents with  ? Routine Post Op  ?  POV #2 DOS 10/27/2021 RT HALLUX IPJ FUSION, RT HALLUX GREAT TOENAIL TOATL PERMANENT AVULSION- sutures intact. Minimal pain. Elevating extremity and using surgical shoe when ambulating.   ? ? ? ?46 y.o. male returns for post-op check.  ? ?Review of Systems: Negative except as noted in the HPI. Denies N/V/F/Ch. ? ? ?Objective:  ?There were no vitals filed for this visit. ?There is no height or weight on file to calculate BMI. ?Constitutional Well developed. ?Well nourished.  ?Vascular Foot warm and well perfused. ?Capillary refill normal to all digits.  Calf is soft and supple, no posterior calf or knee pain, negative Homans' sign  ?Neurologic Normal speech. ?Oriented to person, place, and time. ?Epicritic sensation to light touch grossly present bilaterally.  ?Dermatologic Skin healing well without signs of infection. Skin edges well coapted without signs of infection.  ?Orthopedic: Tenderness to palpation noted about the surgical site.  ? ? ?Assessment:  ? ?1. Acquired deformity of right toe   ?2. Post-operative state   ? ?Plan:  ?Patient was evaluated and treated and all questions answered. ? ?S/p foot surgery right ?-Progressing as expected post-operatively. ?-Sutures removed uneventfully ?-Continue WBAT in surgical shoe ?-Resume regular bathing ? ? ?Return in about 3 weeks (around 12/10/2021) for post op (new x-rays).  ?

## 2021-11-27 ENCOUNTER — Ambulatory Visit: Payer: Medicare Other | Admitting: Podiatry

## 2021-12-01 ENCOUNTER — Telehealth: Payer: Self-pay | Admitting: *Deleted

## 2021-12-01 NOTE — Telephone Encounter (Signed)
Patient is requesting a refill of pain medicine(oxycodone, 5 mg) ibuprofen is only lasting for 8 hours. Please advise.

## 2021-12-02 ENCOUNTER — Other Ambulatory Visit: Payer: Self-pay | Admitting: Podiatry

## 2021-12-02 MED ORDER — OXYCODONE HCL 5 MG PO TABS: 5.0000 mg | ORAL_TABLET | ORAL | 0 refills | Status: DC | PRN

## 2021-12-02 NOTE — Telephone Encounter (Signed)
Patient notified that medication has been sent to pharmacy. 

## 2021-12-08 ENCOUNTER — Other Ambulatory Visit: Payer: Self-pay | Admitting: Podiatry

## 2021-12-08 ENCOUNTER — Telehealth: Payer: Self-pay | Admitting: *Deleted

## 2021-12-08 MED ORDER — OXYCODONE HCL 5 MG PO TABS
5.0000 mg | ORAL_TABLET | ORAL | 0 refills | Status: DC | PRN
Start: 2021-12-08 — End: 2021-12-14

## 2021-12-08 NOTE — Telephone Encounter (Signed)
Refill sent. Please notify  

## 2021-12-08 NOTE — Telephone Encounter (Signed)
Patient calling for a medication refill of pain meds (oxycodone- 5 mg, ibuprofen -mg). Please advise.

## 2021-12-08 NOTE — Telephone Encounter (Signed)
Patient notified thru voice message

## 2021-12-10 ENCOUNTER — Encounter: Payer: Medicare Other | Admitting: Podiatry

## 2021-12-11 ENCOUNTER — Ambulatory Visit (INDEPENDENT_AMBULATORY_CARE_PROVIDER_SITE_OTHER): Payer: Medicare Other

## 2021-12-11 ENCOUNTER — Ambulatory Visit (INDEPENDENT_AMBULATORY_CARE_PROVIDER_SITE_OTHER): Payer: Medicare Other | Admitting: Podiatry

## 2021-12-11 ENCOUNTER — Encounter: Payer: Self-pay | Admitting: Podiatry

## 2021-12-11 DIAGNOSIS — Z9889 Other specified postprocedural states: Secondary | ICD-10-CM

## 2021-12-11 DIAGNOSIS — Z09 Encounter for follow-up examination after completed treatment for conditions other than malignant neoplasm: Secondary | ICD-10-CM

## 2021-12-11 DIAGNOSIS — M2061 Acquired deformities of toe(s), unspecified, right foot: Secondary | ICD-10-CM | POA: Diagnosis not present

## 2021-12-11 DIAGNOSIS — M2031 Hallux varus (acquired), right foot: Secondary | ICD-10-CM

## 2021-12-11 NOTE — Progress Notes (Addendum)
  Subjective:  Patient ID: Barry Beltran, male    DOB: 05-26-1976,  MRN: [id number]  No chief complaint on file.   DOS: 10/27/21 Procedure: Right hallux IPJ fusion and total avulsion of right hallux nail permanent.   46 y.o. male returns for POV#3. 6 weeks out and doing ok relates he has been having constant pain he thinks from wearing the surgical shoe for this long a period as most of it is pain in his hips and knees.   Has been managing with pain medicine.    Review of Systems: Negative except as noted in the HPI. Denies N/V/F/Ch.  Past Medical History:  Diagnosis Date   Hypertension     Current Outpatient Medications:    amLODipine (NORVASC) 5 MG tablet, TAKE 1 TABLET (5 MG TOTAL) BY MOUTH DAILY., Disp: 30 tablet, Rfl: 5   atorvastatin (LIPITOR) 20 MG tablet, 1 tablet Orally Once a day for 30 day(s), Disp: , Rfl:    celecoxib (CELEBREX) 200 MG capsule, Take by mouth., Disp: , Rfl:    cyclobenzaprine (FLEXERIL) 5 MG tablet, TAKE 1 TABLET TWICE A DAY AS NEEDED MUSCLE SPASMS, Disp: , Rfl:    gabapentin (NEURONTIN) 300 MG capsule, 1 capsule Orally Once a day for 30 day(s), Disp: , Rfl:    meloxicam (MOBIC) 15 MG tablet, 1 tablet Orally Once a day for 30 day(s), Disp: , Rfl:    methocarbamol (ROBAXIN) 750 MG tablet, 1 tablet Orally every 6 hrs for 30 day(s), Disp: , Rfl:    montelukast (SINGULAIR) 10 MG tablet, 1 tablet Orally Once a day for 30 day(s), Disp: , Rfl:    ondansetron (ZOFRAN) 4 MG tablet, Take 1 tablet (4 mg total) by mouth every 8 (eight) hours as needed for nausea or vomiting., Disp: 20 tablet, Rfl: 0   oxyCODONE (ROXICODONE) 5 MG immediate release tablet, Take 1 tablet (5 mg total) by mouth every 4 (four) hours as needed for severe pain., Disp: 30 tablet, Rfl: 0   traZODone (DESYREL) 150 MG tablet, Take by mouth., Disp: , Rfl:   Current Facility-Administered Medications:    TDaP (BOOSTRIX) injection 0.5 mL, 0.5 mL, Intramuscular, Once, Baxley, Cresenciano Lick, MD  Social  History   Tobacco Use  Smoking Status Every Day   Packs/day: 0.80   Types: Cigarettes  Smokeless Tobacco Never    No Known Allergies Objective:  There were no vitals filed for this visit. There is no height or weight on file to calculate BMI. Constitutional Well developed. Well nourished.  Vascular Foot warm and well perfused. Capillary refill normal to all digits.   Neurologic Normal speech. Oriented to person, place, and time. Epicritic sensation to light touch grossly present bilaterally.  Dermatologic Skin healing well without signs of infection. Skin edges well coapted without signs of infection. Nail bed healing well as well.   Orthopedic: Tenderness to palpation noted about the surgical site.   Radiographs: Hardware intact and toe well aligned. Bony bridging noted.  Assessment:   1. Post-operative state   2. Hallux hammertoe, right    Plan:  Patient was evaluated and treated and all questions answered.  S/p foot surgery right -Progressing as expected post-operatively. -WB Status: May begin WBAT in regular shoe  -Medications: n/a -Foot redressed. Follow-up in 6 weeks for recheck.   No follow-ups on file.

## 2021-12-14 ENCOUNTER — Encounter: Payer: Self-pay | Admitting: Podiatry

## 2021-12-14 ENCOUNTER — Telehealth: Payer: Self-pay | Admitting: *Deleted

## 2021-12-14 ENCOUNTER — Other Ambulatory Visit: Payer: Self-pay | Admitting: Podiatry

## 2021-12-14 ENCOUNTER — Telehealth: Payer: Self-pay | Admitting: Podiatry

## 2021-12-14 MED ORDER — OXYCODONE HCL 5 MG PO TABS: 5.0000 mg | ORAL_TABLET | ORAL | 0 refills | Status: DC | PRN

## 2021-12-14 MED ORDER — OXYCODONE HCL 5 MG PO TABS
5.0000 mg | ORAL_TABLET | ORAL | 0 refills | Status: DC | PRN
Start: 2021-12-14 — End: 2021-12-15

## 2021-12-14 NOTE — Telephone Encounter (Signed)
Patient called back and stated that [facility] pharmacy did not have medication as well. He is currently at the [facility] in [place] on [address] and would like the pain meds resent to that pharmacy.

## 2021-12-14 NOTE — Telephone Encounter (Signed)
Patient is calling for a refill of oxycodone- 5 mg , please advise.

## 2021-12-14 NOTE — Telephone Encounter (Signed)
Patient called in reference to the oxyCODONE (ROXICODONE) 5 MG immediate release [facility] He ask that it be sent to [facility] located at  [address] [place], [place] [address]

## 2021-12-14 NOTE — Telephone Encounter (Signed)
Sent over thanks

## 2021-12-14 NOTE — Telephone Encounter (Signed)
Patient has been notified

## 2021-12-14 NOTE — Telephone Encounter (Signed)
Refilled. Please Notify. Thanks

## 2021-12-14 NOTE — Telephone Encounter (Signed)
[facility], please resend to [facility] in [place], patient said that they do have in stock.

## 2021-12-14 NOTE — Telephone Encounter (Signed)
Sent. Thanks.   

## 2021-12-15 ENCOUNTER — Telehealth: Payer: Self-pay | Admitting: *Deleted

## 2021-12-15 ENCOUNTER — Telehealth: Payer: Self-pay | Admitting: Podiatry

## 2021-12-15 ENCOUNTER — Other Ambulatory Visit: Payer: Self-pay | Admitting: Podiatry

## 2021-12-15 MED ORDER — OXYCODONE HCL 5 MG PO TABS: 5.0000 mg | ORAL_TABLET | ORAL | 0 refills | Status: AC | PRN

## 2021-12-15 NOTE — Telephone Encounter (Signed)
Pt called about medication refill. [facility] did not get the rx. It appears that you tried to send it but it did not go thru. Can you please resend the rx to [facility] in [place].

## 2021-12-15 NOTE — Telephone Encounter (Signed)
Notified pt it was resent. Patient said thank you.

## 2021-12-15 NOTE — Telephone Encounter (Signed)
Sent again.  Thanks.

## 2021-12-15 NOTE — Telephone Encounter (Signed)
Patient calling for status of prescription sent. Called pharmacy and they are preparing the prescription to be ready for patient. Patient is aware.

## 2021-12-21 ENCOUNTER — Other Ambulatory Visit: Payer: Self-pay | Admitting: Podiatry

## 2021-12-21 ENCOUNTER — Telehealth: Payer: Self-pay | Admitting: *Deleted

## 2021-12-21 MED ORDER — OXYCODONE HCL 5 MG PO CAPS: 5.0000 mg | ORAL_CAPSULE | ORAL | 0 refills | Status: AC | PRN

## 2021-12-21 NOTE — Telephone Encounter (Signed)
Patient is calling to request his pain medicine (oxycodone-5 mg)early since a lot of pharmacies are out of if possible. If he can, please send to [facility] in [place].

## 2021-12-21 NOTE — Telephone Encounter (Signed)
Patient has been notified

## 2021-12-24 ENCOUNTER — Ambulatory Visit: Payer: Medicare Other | Admitting: Podiatry

## 2021-12-30 ENCOUNTER — Other Ambulatory Visit: Payer: Self-pay | Admitting: Podiatry

## 2021-12-30 ENCOUNTER — Telehealth: Payer: Self-pay | Admitting: *Deleted

## 2021-12-30 MED ORDER — OXYCODONE HCL 5 MG PO TABS: 5.0000 mg | ORAL_TABLET | ORAL | 0 refills | Status: DC | PRN

## 2021-12-30 NOTE — Telephone Encounter (Signed)
Patient requesting a pain medication refill, (Oxycodone-5 mg) please send to [facility] in [place].Please advise.

## 2021-12-31 ENCOUNTER — Telehealth: Payer: Self-pay | Admitting: Podiatry

## 2021-12-31 ENCOUNTER — Other Ambulatory Visit: Payer: Self-pay | Admitting: Podiatry

## 2021-12-31 MED ORDER — OXYCODONE HCL 5 MG PO TABS: 5.0000 mg | ORAL_TABLET | ORAL | 0 refills | Status: DC | PRN

## 2021-12-31 NOTE — Telephone Encounter (Signed)
Pt was notified.  

## 2021-12-31 NOTE — Telephone Encounter (Signed)
I did call the [facility] pharmacy to confirm he did not pick up that medication and they did confirm the medication is on backorder at this time.

## 2021-12-31 NOTE — Telephone Encounter (Signed)
Pt called and stated that the RX for Oxycodone that was sent to Barry Beltran is not available at that location. Pt wants to know if [facility] [place], [address]. Please advise

## 2022-01-07 ENCOUNTER — Telehealth: Payer: Self-pay | Admitting: *Deleted

## 2022-01-07 ENCOUNTER — Other Ambulatory Visit: Payer: Self-pay | Admitting: Podiatry

## 2022-01-07 MED ORDER — OXYCODONE HCL 5 MG PO TABS
5.0000 mg | ORAL_TABLET | ORAL | 0 refills | Status: DC | PRN
Start: 2022-01-07 — End: 2022-01-08

## 2022-01-07 NOTE — Telephone Encounter (Signed)
Patient is requesting another refill of the Oxycodone 5 mg. He is still having swelling and pain,"hurting like crazy." Please advise.

## 2022-01-07 NOTE — Telephone Encounter (Signed)
Patient notified

## 2022-01-07 NOTE — Telephone Encounter (Signed)
Refill sent please notify

## 2022-01-08 ENCOUNTER — Other Ambulatory Visit: Payer: Self-pay | Admitting: Podiatry

## 2022-01-08 MED ORDER — OXYCODONE HCL 5 MG PO TABS: 5.0000 mg | ORAL_TABLET | ORAL | 0 refills | Status: AC | PRN

## 2022-01-08 NOTE — Telephone Encounter (Signed)
Sent over [facility] notify

## 2022-01-08 NOTE — Telephone Encounter (Signed)
Patient notified

## 2022-01-08 NOTE — Telephone Encounter (Signed)
Patient is calling because his pharmacy on file does not have the medicine in stock, [facility] [address] [facility] [address]

## 2022-01-18 ENCOUNTER — Other Ambulatory Visit: Payer: Self-pay | Admitting: Podiatry

## 2022-01-18 ENCOUNTER — Telehealth: Payer: Self-pay | Admitting: *Deleted

## 2022-01-18 ENCOUNTER — Telehealth: Payer: Self-pay | Admitting: Podiatry

## 2022-01-18 MED ORDER — OXYCODONE HCL 5 MG PO TABS: 5.0000 mg | ORAL_TABLET | ORAL | 0 refills | Status: DC | PRN

## 2022-01-18 NOTE — Telephone Encounter (Signed)
Just sent him in a refill to the [facility] in [place] per call about an hour ago.

## 2022-01-18 NOTE — Telephone Encounter (Signed)
"  I'm requesting a refill on the Oxycodone 5mg .  The toe is hurting and the foot is hurting.  I think it's doing okay then it pops.  Then the rest of the day it's just painful after I get up in the morning.  I you would, please refill.  Call [facility] into [facility] [place] on [address]

## 2022-01-18 NOTE — Telephone Encounter (Signed)
Patient was calling about a refill on pain meds, would like it sent to the [facility] on [address] [place]

## 2022-01-19 ENCOUNTER — Telehealth: Payer: Self-pay | Admitting: Podiatry

## 2022-01-19 ENCOUNTER — Other Ambulatory Visit: Payer: Self-pay | Admitting: Podiatry

## 2022-01-19 MED ORDER — OXYCODONE HCL 5 MG PO TABS
5.0000 mg | ORAL_TABLET | ORAL | 0 refills | Status: DC | PRN
Start: 2022-01-19 — End: 2022-02-01

## 2022-01-19 NOTE — Telephone Encounter (Signed)
Will resend

## 2022-01-19 NOTE — Telephone Encounter (Signed)
[facility] said [facility] not [facility] prescription from yesterday.  System is saying that it failed, can it be retransmitted ?

## 2022-01-22 ENCOUNTER — Encounter: Payer: Self-pay | Admitting: Podiatry

## 2022-01-22 ENCOUNTER — Ambulatory Visit (INDEPENDENT_AMBULATORY_CARE_PROVIDER_SITE_OTHER): Payer: Medicare Other | Admitting: Podiatry

## 2022-01-22 DIAGNOSIS — M205X1 Other deformities of toe(s) (acquired), right foot: Secondary | ICD-10-CM | POA: Diagnosis not present

## 2022-01-22 DIAGNOSIS — Z9889 Other specified postprocedural states: Secondary | ICD-10-CM

## 2022-01-22 DIAGNOSIS — G5791 Unspecified mononeuropathy of right lower limb: Secondary | ICD-10-CM | POA: Diagnosis not present

## 2022-01-22 DIAGNOSIS — M2031 Hallux varus (acquired), right foot: Secondary | ICD-10-CM

## 2022-01-22 DIAGNOSIS — M79671 Pain in right foot: Secondary | ICD-10-CM

## 2022-01-22 DIAGNOSIS — G8929 Other chronic pain: Secondary | ICD-10-CM

## 2022-01-22 MED ORDER — DEXAMETHASONE SODIUM PHOSPHATE 120 MG/30ML IJ SOLN
4.0000 mg | Freq: Once | INTRAMUSCULAR | Status: AC
  Administered 2022-01-22: 4 mg via INTRA_ARTICULAR

## 2022-01-22 NOTE — Progress Notes (Signed)
Subjective:  Patient ID: Barry Beltran, male    DOB: [birth year],  MRN: [id number]  Chief Complaint  Patient presents with   Routine Post Op    POV4 -dos 10/22/21    DOS: 10/27/21 Procedure: Right hallux IPJ fusion and total avulsion of right hallux nail permanent.   46 y.o. male returns for POV#4. 12 weeks out states the great toe is doing well and not having any pain in that toe. Now in regular shoes. However relates now getting more pain in the big toe joint as well as in his third and fourth toe areas. Relates he has continued with pain medicine because halfway through day getting so much swelling and nerve pain in his feet.    Has been managing with pain medicine.    Review of Systems: Negative except as noted in the HPI. Denies N/V/F/Ch.  Past Medical History:  Diagnosis Date   Hypertension     Current Outpatient Medications:    amLODipine (NORVASC) 5 MG tablet, TAKE 1 TABLET (5 MG TOTAL) BY MOUTH DAILY., Disp: 30 tablet, Rfl: 5   atorvastatin (LIPITOR) 20 MG tablet, 1 tablet Orally Once a day for 30 day(s), Disp: , Rfl:    celecoxib (CELEBREX) 200 MG capsule, Take by mouth., Disp: , Rfl:    cyclobenzaprine (FLEXERIL) 5 MG tablet, TAKE 1 TABLET TWICE A DAY AS NEEDED MUSCLE SPASMS, Disp: , Rfl:    gabapentin (NEURONTIN) 300 MG capsule, 1 capsule Orally Once a day for 30 day(s), Disp: , Rfl:    meloxicam (MOBIC) 15 MG tablet, 1 tablet Orally Once a day for 30 day(s), Disp: , Rfl:    methocarbamol (ROBAXIN) 750 MG tablet, 1 tablet Orally every 6 hrs for 30 day(s), Disp: , Rfl:    montelukast (SINGULAIR) 10 MG tablet, 1 tablet Orally Once a day for 30 day(s), Disp: , Rfl:    ondansetron (ZOFRAN) 4 MG tablet, Take 1 tablet (4 mg total) by mouth every 8 (eight) hours as needed for nausea or vomiting., Disp: 20 tablet, Rfl: 0   oxyCODONE (OXY IR/ROXICODONE) 5 MG immediate release tablet, Take 1 tablet (5 mg total) by mouth every 4 (four) hours as needed for severe pain., Disp: 30 tablet,  Rfl: 0   traZODone (DESYREL) 150 MG tablet, Take by mouth., Disp: , Rfl:   Current Facility-Administered Medications:    TDaP (BOOSTRIX) injection 0.5 mL, 0.5 mL, Intramuscular, Once, Baxley, Luanna Cole, MD  Social History   Tobacco Use  Smoking Status Every Day   Packs/day: 0.80   Types: Cigarettes  Smokeless Tobacco Never    No Known Allergies Objective:  There were no vitals filed for this visit. There is no height or weight on file to calculate BMI. Constitutional Well developed. Well nourished.  Vascular Foot warm and well perfused. Capillary refill normal to all digits.   Neurologic Normal speech. Oriented to person, place, and time. Epicritic sensation to light touch grossly present bilaterally.  Dermatologic Skin healing well without signs of infection. Skin edges well coapted without signs of infection. Nail bed healing well as well. Right hallux nail doing well  Orthopedic: No tenderness to palpation noted about the surgical site. There is significant pain and sensitivity over first MPJ on the right and well as MPJ of third and fourth digits on right. Pain with ROM of te joints and pain tracking proximally to the ankle. Mild edema noted as well.    Radiographs: Hardware intact and toe well aligned. Bony bridging noted.  Assessment:   1. Post-operative state   2. Hallux hammertoe, right   3. Hallux limitus of right foot   4. Neuritis of right foot   5. Chronic pain in right foot    Plan:  Patient was evaluated and treated and all questions answered.  S/p foot surgery right -Progressing as expected post-operatively. -WB Status: Continue WBAT in regular shoe  -Medications: n/a -Foot redressed. From surgical standpoint patient is discharged at this time.  -Will refer to pain management for nerve damage in foot and chronic pain and need for pain medication management.  -Is requesting injection in to areas of more significant pain. Procedure below.  Follow-up as  needed for continued pain in the rest of his foot.   Procedure: Injection Tendon/Ligament Discussed alternatives, risks, complications and verbal consent was obtained.  Location: Right first metatarsophalangeal joint and right third metatarsophalangeal joint. . Skin Prep: Alcohol. Injectate: 1cc 0.5% marcaine plain, 1 cc dexamethasone.  Disposition: Patient tolerated procedure well. Injection site dressed with a band-aid.  Post-injection care was discussed and return precautions discussed.    Return if symptoms worsen or fail to improve.

## 2022-02-01 ENCOUNTER — Encounter: Payer: Self-pay | Admitting: Physical Medicine & Rehabilitation

## 2022-02-01 ENCOUNTER — Other Ambulatory Visit: Payer: Self-pay | Admitting: Podiatry

## 2022-02-01 ENCOUNTER — Telehealth: Payer: Self-pay

## 2022-02-01 MED ORDER — OXYCODONE HCL 5 MG PO TABS: 5.0000 mg | ORAL_TABLET | ORAL | 0 refills | Status: DC | PRN

## 2022-02-01 NOTE — Telephone Encounter (Signed)
No additional notes are needed 

## 2022-02-12 ENCOUNTER — Other Ambulatory Visit: Payer: Self-pay | Admitting: Podiatry

## 2022-02-12 MED ORDER — OXYCODONE HCL 5 MG PO TABS: 5.0000 mg | ORAL_TABLET | ORAL | 0 refills | Status: DC | PRN

## 2022-02-12 NOTE — Telephone Encounter (Signed)
Pt states he cannot get into the pain management clinic until 03/29/22 and wants to know if Dr Ralene Cork can refill his oxycodone. The [facility] not have it  in stock so if she approves the refill, pt would like [facility] sent to [facility] [address].  Please advise.

## 2022-02-12 NOTE — Telephone Encounter (Signed)
I will send refill in. Thanks

## 2022-02-19 ENCOUNTER — Other Ambulatory Visit: Payer: Self-pay | Admitting: *Deleted

## 2022-02-19 NOTE — Telephone Encounter (Signed)
Patient is requesting a refill of pain medicine, please [facility] to [facility]).

## 2022-02-22 ENCOUNTER — Other Ambulatory Visit: Payer: Self-pay

## 2022-02-22 MED ORDER — OXYCODONE HCL 5 MG PO TABS: 5.0000 mg | ORAL_TABLET | ORAL | 0 refills | Status: DC | PRN

## 2022-03-02 ENCOUNTER — Other Ambulatory Visit: Payer: Self-pay | Admitting: Podiatry

## 2022-03-02 ENCOUNTER — Telehealth: Payer: Self-pay | Admitting: *Deleted

## 2022-03-02 MED ORDER — OXYCODONE HCL 5 MG PO TABS: 5.0000 mg | ORAL_TABLET | ORAL | 0 refills | Status: DC | PRN

## 2022-03-02 NOTE — Telephone Encounter (Signed)
Sent refill. Thanks. °

## 2022-03-02 NOTE — Telephone Encounter (Signed)
Patient calling to request a pain medicine refill( oxycodone-5 mg ), please advise.

## 2022-03-03 NOTE — Telephone Encounter (Signed)
Patient has received the prescription.

## 2022-03-09 ENCOUNTER — Other Ambulatory Visit: Payer: Self-pay

## 2022-03-09 MED ORDER — OXYCODONE HCL 5 MG PO TABS: 5.0000 mg | ORAL_TABLET | ORAL | 0 refills | Status: DC | PRN

## 2022-03-16 ENCOUNTER — Telehealth: Payer: Self-pay | Admitting: *Deleted

## 2022-03-16 ENCOUNTER — Other Ambulatory Visit: Payer: Self-pay | Admitting: Podiatry

## 2022-03-16 MED ORDER — OXYCODONE HCL 5 MG PO TABS: 5.0000 mg | ORAL_TABLET | ORAL | 0 refills | Status: DC | PRN

## 2022-03-16 NOTE — Telephone Encounter (Signed)
Patient notified

## 2022-03-16 NOTE — Telephone Encounter (Signed)
Patient is requesting a pain medication refill(oxycodone-5 mg ),please send to :[id number] [facility] [place], [address],[place].

## 2022-03-16 NOTE — Telephone Encounter (Signed)
Refill sent. Thanks

## 2022-03-22 ENCOUNTER — Other Ambulatory Visit: Payer: Self-pay | Admitting: Podiatry

## 2022-03-22 ENCOUNTER — Telehealth: Payer: Self-pay | Admitting: *Deleted

## 2022-03-22 ENCOUNTER — Other Ambulatory Visit: Payer: Self-pay

## 2022-03-22 ENCOUNTER — Telehealth: Payer: Self-pay | Admitting: Podiatry

## 2022-03-22 MED ORDER — OXYCODONE HCL 5 MG PO TABS: 5.0000 mg | ORAL_TABLET | ORAL | 0 refills | Status: DC | PRN

## 2022-03-22 NOTE — Telephone Encounter (Signed)
Patient has been notified

## 2022-03-22 NOTE — Telephone Encounter (Signed)
Patient is calling to ask that the medication(oxycodone-5 mg) was sent to wrong pharmacy,requesting [facility], Please advise.

## 2022-03-22 NOTE — Telephone Encounter (Signed)
Taken care of

## 2022-03-22 NOTE — Telephone Encounter (Signed)
[facility] called [facility] in [place] and medication was sent to the [facility] in [place] [facility] pt was at last week. Forwarded the call to the nurse

## 2022-03-22 NOTE — Telephone Encounter (Signed)
Resent   thanks

## 2022-03-29 ENCOUNTER — Other Ambulatory Visit: Payer: Self-pay | Admitting: Podiatry

## 2022-03-29 ENCOUNTER — Telehealth: Payer: Self-pay | Admitting: *Deleted

## 2022-03-29 ENCOUNTER — Encounter: Payer: Medicare Other | Attending: Physical Medicine & Rehabilitation | Admitting: Physical Medicine & Rehabilitation

## 2022-03-29 ENCOUNTER — Encounter: Payer: Self-pay | Admitting: Physical Medicine & Rehabilitation

## 2022-03-29 VITALS — BP 150/95 | HR 77 | Wt 171.2 lb

## 2022-03-29 DIAGNOSIS — M25551 Pain in right hip: Secondary | ICD-10-CM | POA: Diagnosis present

## 2022-03-29 DIAGNOSIS — M25552 Pain in left hip: Secondary | ICD-10-CM | POA: Diagnosis present

## 2022-03-29 DIAGNOSIS — Z79891 Long term (current) use of opiate analgesic: Secondary | ICD-10-CM | POA: Insufficient documentation

## 2022-03-29 DIAGNOSIS — M47816 Spondylosis without myelopathy or radiculopathy, lumbar region: Secondary | ICD-10-CM | POA: Insufficient documentation

## 2022-03-29 DIAGNOSIS — Z5181 Encounter for therapeutic drug level monitoring: Secondary | ICD-10-CM | POA: Diagnosis present

## 2022-03-29 DIAGNOSIS — G894 Chronic pain syndrome: Secondary | ICD-10-CM | POA: Diagnosis present

## 2022-03-29 DIAGNOSIS — M25571 Pain in right ankle and joints of right foot: Secondary | ICD-10-CM | POA: Insufficient documentation

## 2022-03-29 MED ORDER — OXYCODONE HCL 5 MG PO TABS: 5.0000 mg | ORAL_TABLET | ORAL | 0 refills | Status: DC | PRN

## 2022-03-29 NOTE — Telephone Encounter (Signed)
Pt requested a refill on Rx for pain. Please advise

## 2022-03-29 NOTE — Telephone Encounter (Signed)
Patient is requesting a pain medicine refill of the oxycodone-5 mg. He wanted to let the doctor know that he is also seeing pain management for evaluation today.

## 2022-03-29 NOTE — Telephone Encounter (Signed)
Will wait on refill to see what pain management suggests.

## 2022-03-29 NOTE — Telephone Encounter (Signed)
Refill sent in for him.

## 2022-03-29 NOTE — Progress Notes (Addendum)
Subjective:    Patient ID: Barry Beltran, male    DOB: Jan 16, 1976, 46 y.o.   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  Patient reports much of his pain is due to his prior surgeries.  Patient reports he had an ACL injury of his right knee which required surgery in 2015.  He then had a fusion of his right foot/ankle in 2016 that required repeat surgery in 2017.  He then had bone spurs removed from his right foot in 2017.  Reports he developed a required ankle replacement surgery in 2018 and then a Hallux valgus surgery March 2023.  Patient reports he developed left hip pain requiring replacement in 2021 and later right hip pain that was replaced shortly after.  He had a right hip dislocation and required an additional surgery for this.  Barry Beltran reports that his greatest pain in his right ankle and right hip.  He also has back pain due to herniated disks and reports a history of 18 epidural injections.  He reports occasional shooting pain down his left lower extremity.  Pain is constant sharp and burning.  He has had multiple rounds of physical therapy with some improvement.  To help relieve his pain he uses Tylenol 1 g 3 times a day.  He also takes meloxicam 15 mg, and gabapentin 300 mg 3 times daily.  Patient is also been getting oxycodone and occasionally tramadol.  He takes oxycodone about 5 mg 4-5 times a day and this helps to control his pain.  He reports this allows him to be more active at home and to walk much further.  Gabapentin dose is limited by sedation at higher doses.  He also wears a brace on his right foot often      Pain Inventory Average Pain 7 Pain Right Now 8 My pain is constant, sharp, burning, and stabbing  In the last 24 hours, has pain  interfered with the following? General activity 6 Relation with others 3 Enjoyment of life 7 What TIME of day is your pain at its worst? varies Sleep (in general) Fair  Pain is worse with: walking, bending, and some activites Pain improves with: rest, heat/ice, and medication Relief from Meds: 7  walk without assistance walk with assistance use a cane how many minutes can you walk? 30-60 ability to climb steps?  yes do you drive?  yes  disabled: date disabled 2016  weakness numbness tingling trouble walking spasms depression anxiety  Any changes since last visit?  no  Any changes since last visit?  no    Family History  Problem Relation Age of Onset   Hypertension Mother    Social History   Socioeconomic History   Marital status: Married    Spouse name: Not on file   Number of children: Not on file   Years of education: Not on file   Highest education level: Not on file  Occupational History   Not on file  Tobacco Use   Smoking status: Every Day    Packs/day: 0.80    Types: Cigarettes   Smokeless tobacco: Never  Substance and Sexual Activity   Alcohol use: Yes    Comment: rarely   Drug use: No   Sexual activity: Not on file  Other Topics Concern   Not on file  Social History Narrative   Not on file   Social Determinants of Health   Financial Resource Strain: Not on file  Food Insecurity: Not on file  Transportation Needs: Not on file  Physical Activity: Not on file  Stress: Not on file  Social Connections: Not on file   History reviewed. No pertinent surgical history. Past Medical History:  Diagnosis Date   Hypertension    BP (!) 150/95   Pulse 77   Wt 171 lb 3.2 oz (77.7 kg)   SpO2 98%   Opioid Risk Score:   Fall Risk Score:  `1  Depression screen [facility] 2/9     03/29/2022   10:51 AM  Depression screen [facility] 2/9  Decreased Interest 0  Down, Depressed, Hopeless 0  PHQ - 2 Score 0  Altered sleeping 2  Change in appetite 0   Feeling bad or failure about yourself  0  Trouble concentrating 0  Moving slowly or fidgety/restless 0  Suicidal thoughts 0  PHQ-9 Score 2  Difficult doing work/chores Not difficult at all     Review of Systems  Musculoskeletal:  Positive for back pain.       Bilateral foot pain Bilateral hip pain Neck pain  All other systems reviewed and are negative.     Objective:   Physical Exam  Gen: no distress, normal appearing HEENT: oral mucosa pink and moist, NCAT Cardio: Reg rate Chest: normal effort, normal rate of breathing Abd: soft, non-distended Ext: no edema Psych: pleasant, normal affect Skin: intact Neuro: Alert and oriented x4, follows commands, cranial nerves II through XII grossly intact, sensation intact light touch in all 4 extremities, strength 5 out of 5 in bilateral shoulder abduction, elbow flexion extension, finger flexion Strength 5 out of 5 in bilateral hip flexion, knee extension Strength 5 out of 5 left ankle PF and DF, strength 4 out of 5 right ankle PF and DF Sensation light touch intact in all 4 extremities Musculoskeletal:  SLR negative bilaterally, fair and FABER caused back pain, facet loading negative, no significant QL tenderness, mild L-spine bilateral tenderness of the paraspinals Very tender to palpation throughout his left ankle and midfoot Right knee with tenderness at the joint line Right hip greater than left hip tenderness Pain with hip internal and external rotation right greater than left Several healed incisions on his right foot and ankle Antalgic gait    MRI L spine 09/17/21  FINDINGS: No significant interval change.   For the purposes of this dictation, it is assumed that the most caudal fully-segmented lumbar-type vertebra is L5, and that the most caudal fully-developed intervertebral disc is L5-S1.   Degenerative disc disease (DDD) and facet arthropathy (see below).   Vertebral alignment: Slight retrolisthesis of L3 on L4, as  before.   Marrow signal: No significant abnormality.  Vertebral body heights: Normal.  Conus medullaris: Terminates at a normal level. Normal in size and signal intensity.   Individual disc levels:   T12-L1: Normal.  L1-2: Normal.  L2-3: Normal.   L3-4: Degenerative changes, including slight retrolisthesis and a small left foraminal zone disc-osteophyte complex. Mild left neural foraminal stenosis. No significant spinal canal or right neural foraminal stenosis. Appearance similar to prior.   L4-5: Mild degenerative changes, including a small right foraminal zone disc-osteophyte complex. No significant central zone stenosis. Mild bilateral subarticular zone stenosis. Mild right neural foraminal stenosis. No significant left neural foraminal stenosis. Appearance similar to prior.  L5-S1: Mild degenerative changes. Small left subarticular zone disc protrusion, as before, with impingement on the descending left S1 nerve root. Overall moderate left subarticular zone stenosis. No significant central zone stenosis. Mild right subarticular zone stenosis. No significant neural foraminal stenosis.  Xray R hip 08/05/21 Standing pelvis and right hip films taken and reviewed.  He has  well-fixed, well-positioned bilateral hip replacement implants, right side  has a dual mobility articulation.  No grossly complicating features   R ankle 05/17/2018 3V WB of the right ankle ordered, obtained, and independently  reviewed/interpreted by myself today reveal, excellent maintenance of  alignment and stable overall TAR construct, with no evidence of hardware  complication or prosthetic loosening, of note however there appears to be  a healed medial malleolar stress fracture.  No acute findings, implant  looks excellent.    Assessment & Plan:  Chronic ankle pain in s/p multiple ankle surgeries -Continue meloxicam -Continue Tylenol as needed -UDS and pain contract completed -We will plan to continue  oxycodone 5 mg up to 5 times a day as needed -Continue gabapentin 300 mg, dose limited by sedation.  Could consider Lyrica at a later time.  Cymbalta and amitriptyline also be options to consider at future visit. 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 Will order oxycodone as above, recheck UDS next visit to ensure he has discontinued use of oxycodone  10/18  Reports pain has been worse since stopping THC gummies. Oxycodone not lasting long enough. He has a f/u in my clinic in 2 days. Increase oxycodone frequency to 6 times a day- will discuss further on Friday at our visit. Discussed not making any changes with his pain medication use prior to discussing it with me, he agreed.

## 2022-04-01 ENCOUNTER — Telehealth: Payer: Self-pay

## 2022-04-01 LAB — TOXASSURE SELECT,+ANTIDEPR,UR

## 2022-04-01 NOTE — Telephone Encounter (Signed)
Patient called and stated his medication was sent to the wrong pharmacy, but I do not see where anything was sent from our clinic. Please advise.

## 2022-04-02 ENCOUNTER — Telehealth: Payer: Self-pay | Admitting: *Deleted

## 2022-04-02 NOTE — Telephone Encounter (Signed)
Urine drug screen for this encounter is consistent for prescribed medication but is also positive for THC/Marijuana

## 2022-04-05 ENCOUNTER — Other Ambulatory Visit: Payer: Self-pay

## 2022-04-05 MED ORDER — OXYCODONE HCL 5 MG PO TABS: 5.0000 mg | ORAL_TABLET | ORAL | 0 refills | Status: DC | PRN

## 2022-04-08 ENCOUNTER — Telehealth: Payer: Self-pay | Admitting: *Deleted

## 2022-04-08 NOTE — Telephone Encounter (Signed)
Barry Beltran has called asking when his pain medication is going to be sent in to the pharmacy. I have let him know Dr Curlene Dolphin is out of the office but will be back tomorrow. He will have to address the urine drug screen which was positive for [facility], to determine if he will be prescribing. "He said he told the CMA that he chews those thc gummies". I informed him that his levels are high and it is still an illegal substance and he will have to wait for Dr Curlene Dolphin to return for his decision.

## 2022-04-09 MED ORDER — OXYCODONE HCL 5 MG PO TABS: 5.0000 mg | ORAL_TABLET | ORAL | 0 refills | Status: DC | PRN

## 2022-04-09 NOTE — Addendum Note (Signed)
Addended by: Jennye Boroughs on: 04/09/2022 06:18 PM   Modules accepted: Orders

## 2022-04-27 ENCOUNTER — Telehealth: Payer: Self-pay

## 2022-04-27 NOTE — Telephone Encounter (Signed)
Barry Beltran stated he has not had any THC Gummies since 04/09/2022. But is having to take and extra dose of Oxycodone 5 MG for pain. He reported taking 6 doses a day. Patient would like to discuss this problem with you.   Call back phone [phone].

## 2022-04-30 ENCOUNTER — Encounter: Payer: Medicare Other | Attending: Physical Medicine & Rehabilitation | Admitting: Physical Medicine & Rehabilitation

## 2022-04-30 ENCOUNTER — Encounter: Payer: Self-pay | Admitting: Physical Medicine & Rehabilitation

## 2022-04-30 VITALS — HR 93 | Wt 167.4 lb

## 2022-04-30 DIAGNOSIS — M25551 Pain in right hip: Secondary | ICD-10-CM

## 2022-04-30 DIAGNOSIS — M25571 Pain in right ankle and joints of right foot: Secondary | ICD-10-CM | POA: Diagnosis present

## 2022-04-30 DIAGNOSIS — G894 Chronic pain syndrome: Secondary | ICD-10-CM | POA: Diagnosis present

## 2022-04-30 DIAGNOSIS — M47816 Spondylosis without myelopathy or radiculopathy, lumbar region: Secondary | ICD-10-CM

## 2022-04-30 DIAGNOSIS — M25552 Pain in left hip: Secondary | ICD-10-CM

## 2022-04-30 DIAGNOSIS — Z79891 Long term (current) use of opiate analgesic: Secondary | ICD-10-CM

## 2022-04-30 MED ORDER — OXYCODONE HCL 5 MG PO TABS: 5.0000 mg | ORAL_TABLET | ORAL | 0 refills | Status: DC | PRN

## 2022-04-30 NOTE — Progress Notes (Signed)
Subjective:    Patient ID: Barry Beltran, male    DOB: [birth year], 46 y.o.   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  Patient reports much of his pain is due to his prior surgeries.  Patient reports he had an ACL injury of his right knee which required surgery in 2015.  He then had a fusion of his right foot/ankle in 2016 that required repeat surgery in 2017.  He then had bone spurs removed from his right foot in 2017.  Reports he developed a required ankle replacement surgery in 2018 and then a Hallux valgus surgery March 2023.  Patient reports he developed left hip pain requiring replacement in 2021 and later right hip pain that was replaced shortly after.  He had a right hip dislocation and required an additional surgery for this.  Barry Beltran reports that his greatest pain in his right ankle and right hip.  He also has back pain due to herniated disks and reports a history of 18 epidural injections.  He reports occasional shooting pain down his left lower extremity.  Pain is constant sharp and burning.  He has had multiple rounds of physical therapy with some improvement.  To help relieve his pain he uses Tylenol 1 g 3 times a day.  He also takes meloxicam 15 mg, and gabapentin 300 mg 3 times daily.  Patient is also been getting oxycodone and occasionally tramadol.  He takes oxycodone about 5 mg 4-5 times a day and this helps to control his pain.  He reports this allows him to be more active at home and to walk much further.  Gabapentin dose is limited by sedation at higher doses.  He also wears a brace on his right foot often  Interval History [name] is here for follow-up of his chronic pain.  Pain continues to be worse in his right ankle.  Walking  continues to worsen the pain.  He continues to also have pain in his lower back will sometimes shoot down his left leg.  He also has pain in his hip on the right.  Patient reports that oxycodone 5 mg 6 times a day is keeping the pain under fairly good control.  And is allowing him to spend time with his family and be more active at home.  He says his pain did get worse a few weeks ago when he stopped using the THC Gummies.  He is not having any side effects the oxycodone.   Pain Inventory Average Pain 8 Pain Right Now 7 My pain is sharp, burning, stabbing, tingling, and aching  In the last 24 hours, has pain interfered with the following? General activity 7 Relation with others 10 Enjoyment of life 10 What TIME of day is your pain at its worst? morning , daytime, evening, and night Sleep (in general) Fair  Pain is worse with: walking, bending, inactivity, and standing Pain improves with: rest, heat/ice, medication, and injections Relief from Meds: 7  Family History  Problem Relation Age of Onset   Hypertension Mother    Social History   Socioeconomic History   Marital status: Married    Spouse name: Not on file   Number of children: Not on file   Years of education: Not  on file   Highest education level: Not on file  Occupational History   Not on file  Tobacco Use   Smoking status: Every Day    Packs/day: 0.80    Types: Cigarettes   Smokeless tobacco: Never  Substance and Sexual Activity   Alcohol use: Yes    Comment: rarely   Drug use: No   Sexual activity: Not on file  Other Topics Concern   Not on file  Social History Narrative   Not on file   Social Determinants of Health   Financial Resource Strain: Not on file  Food Insecurity: Not on file  Transportation Needs: Not on file  Physical Activity: Not on file  Stress: Not on file  Social Connections: Not on file   No past surgical history on file. No past surgical history on file. Past Medical History:   Diagnosis Date   Hypertension    Pulse 93   Wt 167 lb 6.4 oz (75.9 kg)   SpO2 98%   Opioid Risk Score:   Fall Risk Score:  `1  Depression screen [facility] 2/9     04/30/2022   10:05 AM 03/29/2022   10:51 AM  Depression screen PHQ 2/9  Decreased Interest 0 0  Down, Depressed, Hopeless 0 0  PHQ - 2 Score 0 0  Altered sleeping  2  Change in appetite  0  Feeling bad or failure about yourself   0  Trouble concentrating  0  Moving slowly or fidgety/restless  0  Suicidal thoughts  0  PHQ-9 Score  2  Difficult doing work/chores  Not difficult at all     Review of Systems  Constitutional: Negative.   HENT: Negative.    Eyes: Negative.   Respiratory: Negative.    Cardiovascular: Negative.   Gastrointestinal: Negative.   Endocrine: Negative.   Genitourinary: Negative.   Musculoskeletal:  Positive for arthralgias and back pain.  Skin: Negative.   Allergic/Immunologic: Negative.   Neurological: Negative.   Hematological: Negative.   Psychiatric/Behavioral: Negative.    All other systems reviewed and are negative.      Objective:   Physical Exam  Gen: no distress, normal appearing HEENT: oral mucosa pink and moist, NCAT Cardio: Reg rate Chest: normal effort, normal rate of breathing Abd: soft, non-distended Ext: no edema Psych: pleasant, normal affect Skin: intact Neuro: Alert and oriented x4, follows commands, cranial nerves II through XII grossly intact, sensation intact light touch in all 4 extremities, strength 5 out of 5 in bilateral shoulder abduction, elbow flexion extension, finger flexion Strength 5 out of 5 in bilateral hip flexion, knee extension Strength 5 out of 5 left ankle PF and DF, strength 4 out of 5 right ankle PF and DF Sensation light touch intact in all 4 extremities Musculoskeletal:  Slump negative bilaterally facet loading negative mild L-spine bilateral tenderness of the paraspinals Tender to palpation R ankle/midfoot Right knee with  tenderness at the joint line Right hip tenderness greater than left hip tenderness Pain with hip internal and external rotation right greater than left Antalgic gait       MRI L spine 09/17/21  FINDINGS: No significant interval change.   For the purposes of this dictation, it is assumed that the most caudal fully-segmented lumbar-type vertebra is L5, and that the most caudal fully-developed intervertebral disc is L5-S1.   Degenerative disc disease (DDD) and facet arthropathy (see below).   Vertebral alignment: Slight retrolisthesis of L3 on L4, as before.   Marrow signal:  No significant abnormality.  Vertebral body heights: Normal.  Conus medullaris: Terminates at a normal level. Normal in size and signal intensity.   Individual disc levels:   T12-L1: Normal.  L1-2: Normal.  L2-3: Normal.   L3-4: Degenerative changes, including slight retrolisthesis and a small left foraminal zone disc-osteophyte complex. Mild left neural foraminal stenosis. No significant spinal canal or right neural foraminal stenosis. Appearance similar to prior.   L4-5: Mild degenerative changes, including a small right foraminal zone disc-osteophyte complex. No significant central zone stenosis. Mild bilateral subarticular zone stenosis. Mild right neural foraminal stenosis. No significant left neural foraminal stenosis. Appearance similar to prior.   L5-S1: Mild degenerative changes. Small left subarticular zone disc protrusion, as before, with impingement on the descending left S1 nerve root. Overall moderate left subarticular zone stenosis. No significant central zone stenosis. Mild right subarticular zone stenosis. No significant neural foraminal stenosis.   Xray R hip 08/05/21 Standing pelvis and right hip films taken and reviewed.  He has  well-fixed, well-positioned bilateral hip replacement implants, right side  has a dual mobility articulation.  No grossly complicating features    R foot  12/11/21  FINDINGS: Screws throughout all 5 digits traversing the interphalangeal joints. Some fusion of the first digit interphalangeal joint noted. Ankle joint arthroplasty. There is no evidence of fracture or dislocation. No cortical erosion or destruction. Soft tissues are unremarkable.   IMPRESSION: Screws throughout all 5 digits traversing the interphalangeal joints.    R ankle xray 05/08/2019 report "appropriate maintenance of alignment and stable overall TAR construct, with no  evidence of hardware complication or prosthetic loosening.  Forefoot  reconstruction surgery is healing nicely with excellent alignment to the "     Assessment & Plan:   Chronic ankle pain in s/p multiple ankle surgeries -Continue meloxicam -Increase oxycodone 5 mg up to 6 times a day as needed -Continue gabapentin 300 mg, dose limited by sedation.  Could consider Lyrica at a later time.  Cymbalta and amitriptyline also be options to consider at future visit. -PDMP reviewed -Mod opiod risk tool -UDS and pain contract completed last visit, will repeat next visit    Chronic lower back pain with lumbar spondylosis and left-sided radicular pain -Patient reports this has not been the greatest source of his pain recently and is overall improved -Reports multiple prior ESI with limited benefit -L4/5 and L5/S1 b/l Medical Brach block completed by [facility] Doctor March 2023 with benefit - he plans to go back for RFA at this clinic    Hip pain greater on the right s/p bilateral hip replacements -Continue meloxicam

## 2022-05-28 ENCOUNTER — Encounter: Payer: Self-pay | Admitting: Physical Medicine & Rehabilitation

## 2022-05-28 ENCOUNTER — Encounter: Payer: Medicare Other | Attending: Physical Medicine & Rehabilitation | Admitting: Physical Medicine & Rehabilitation

## 2022-05-28 VITALS — BP 166/92 | HR 97 | Wt 169.6 lb

## 2022-05-28 DIAGNOSIS — Z79891 Long term (current) use of opiate analgesic: Secondary | ICD-10-CM

## 2022-05-28 DIAGNOSIS — M47816 Spondylosis without myelopathy or radiculopathy, lumbar region: Secondary | ICD-10-CM

## 2022-05-28 DIAGNOSIS — G894 Chronic pain syndrome: Secondary | ICD-10-CM | POA: Diagnosis not present

## 2022-05-28 DIAGNOSIS — Z5181 Encounter for therapeutic drug level monitoring: Secondary | ICD-10-CM | POA: Diagnosis not present

## 2022-05-28 MED ORDER — NALOXONE HCL 4 MG/0.1ML NA LIQD: NASAL | 0 refills | Status: AC

## 2022-05-28 MED ORDER — OXYCODONE HCL 5 MG PO TABS: 5.0000 mg | ORAL_TABLET | ORAL | 0 refills | Status: DC | PRN

## 2022-05-28 NOTE — Progress Notes (Signed)
Subjective:    Patient ID: Barry Beltran, male    DOB: 30-Jan-1976, 46 y.o.   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  Patient reports much of his pain is due to his prior surgeries.  Patient reports he had an ACL injury of his right knee which required surgery in 2015.  He then had a fusion of his right foot/ankle in 2016 that required repeat surgery in 2017.  He then had bone spurs removed from his right foot in 2017.  Reports he developed a required ankle replacement surgery in 2018 and then a Hallux valgus surgery March 2023.  Patient reports he developed left hip pain requiring replacement in 2021 and later right hip pain that was replaced shortly after.  He had a right hip dislocation and required an additional surgery for this.  Barry Beltran reports that his greatest pain in his right ankle and right hip.  He also has back pain due to herniated disks and reports a history of 18 epidural injections.  He reports occasional shooting pain down his left lower extremity.  Pain is constant sharp and burning.  He has had multiple rounds of physical therapy with some improvement.  To help relieve his pain he uses Tylenol 1 g 3 times a day.  He also takes meloxicam 15 mg, and gabapentin 300 mg 3 times daily.  Patient is also been getting oxycodone and occasionally tramadol.  He takes oxycodone about 5 mg 4-5 times a day and this helps to control his pain.  He reports this allows him to be more active at home and to walk much further.  Gabapentin dose is limited by sedation at higher doses.  He also wears a brace on his right foot often   Visit 05/31/22 Barry Beltran is here for follow-up of his chronic pain.  Pain continues to be worse in his right ankle.   Walking continues to worsen the pain.  He continues to also have pain in his lower back will sometimes shoot down his left leg.  He also has pain in his hip on the right.  Patient reports that oxycodone 5 mg 6 times a day is keeping the pain under fairly good control.  And is allowing him to spend time with his family and be more active at home.  He says his pain did get worse a few weeks ago when he stopped using the THC Gummies.  He is not having any side effects the oxycodone.     Interval History Barry Beltran is here for follow-up of his chronic pain.  He continues to have his worst pain in his right ankle.  This is worsened with ambulation.  He also is having pain in his lower back that shoots into his left thigh.  Oxycodone 5 mg 6 times a day as keeping the pain fairly well controlled.  He is not having any side effects with the medication.  He is not using THC Gummies anymore. Patient reports he is scheduled with [facility] to have repeat injections in his back.  He pulled he noted up on his phone where he had a  L4/5L 5/S1 medial brach block completed earlier this year.  He reports his procedure helped the pain  for over 6 months.  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    Pain Inventory Average Pain 8 Pain Right Now 7 My pain is sharp, burning, stabbing, tingling, and aching  In the last 24 hours, has pain interfered with the following? General activity 6 Relation with others 8 Enjoyment of life 7 What TIME of day is your pain at its worst? evening and night Sleep (in general) Fair  Pain is worse with: walking, bending, and some activites Pain improves with: rest, heat/ice, pacing activities, and medication Relief from Meds: 7  Family History  Problem Relation Age of Onset   Hypertension Mother    Social History   Socioeconomic History   Marital status: Married    Spouse name: Not on file   Number of children: Not on file   Years of  education: Not on file   Highest education level: Not on file  Occupational History   Not on file  Tobacco Use   Smoking status: Every Day    Packs/day: 0.80    Types: Cigarettes   Smokeless tobacco: Never  Substance and Sexual Activity   Alcohol use: Yes    Comment: rarely   Drug use: No   Sexual activity: Not on file  Other Topics Concern   Not on file  Social History Narrative   Not on file   Social Determinants of Health   Financial Resource Strain: Not on file  Food Insecurity: Not on file  Transportation Needs: Not on file  Physical Activity: Not on file  Stress: Not on file  Social Connections: Not on file   No past surgical history on file. No past surgical history on file. Past Medical History:  Diagnosis Date   Hypertension    There were no vitals taken for this visit.  Opioid Risk Score:   Fall Risk Score:  `1  Depression screen [facility] 2/9     04/30/2022   10:05 AM 03/29/2022   10:51 AM  Depression screen PHQ 2/9  Decreased Interest 0 0  Down, Depressed, Hopeless 0 0  PHQ - 2 Score 0 0  Altered sleeping  2  Change in appetite  0  Feeling bad or failure about yourself   0  Trouble concentrating  0  Moving slowly or fidgety/restless  0  Suicidal thoughts  0  PHQ-9 Score  2  Difficult doing work/chores  Not difficult at all      Review of Systems  Musculoskeletal:  Positive for back pain and gait problem.  All other systems reviewed and are negative.     Objective:   Physical Exam  Gen: no distress, normal appearing HEENT: oral mucosa pink and moist, NCAT Chest: normal effort, normal rate of breathing Abd: soft, non-distended Ext: no edema Psych: pleasant, normal affect Skin: intact Neuro: Alert and oriented x4, follows commands, cranial nerves II through XII grossly intact, sensation intact light touch in all 4 extremities, strength 5 out of 5 in bilateral shoulder abduction, elbow flexion extension, finger flexion Strength 5 out of 5  in bilateral hip flexion, knee extension Strength 5 out of 5 left ankle PF and DF, strength 4 out of 5 right ankle PF and DF Sensation light touch intact in all 4 extremities Musculoskeletal:  Slump test negative bilaterally, facet loading negative, mild L-spine paraspinal tenderness Right ankle and midfoot  tenderness  No abnormal tone       MRI L spine 09/17/21  FINDINGS: No  significant interval change.   For the purposes of this dictation, it is assumed that the most caudal fully-segmented lumbar-type vertebra is L5, and that the most caudal fully-developed intervertebral disc is L5-S1.   Degenerative disc disease (DDD) and facet arthropathy (see below).   Vertebral alignment: Slight retrolisthesis of L3 on L4, as before.   Marrow signal: No significant abnormality.  Vertebral body heights: Normal.  Conus medullaris: Terminates at a normal level. Normal in size and signal intensity.   Individual disc levels:   T12-L1: Normal.  L1-2: Normal.  L2-3: Normal.   L3-4: Degenerative changes, including slight retrolisthesis and a small left foraminal zone disc-osteophyte complex. Mild left neural foraminal stenosis. No significant spinal canal or right neural foraminal stenosis. Appearance similar to prior.   L4-5: Mild degenerative changes, including a small right foraminal zone disc-osteophyte complex. No significant central zone stenosis. Mild bilateral subarticular zone stenosis. Mild right neural foraminal stenosis. No significant left neural foraminal stenosis. Appearance similar to prior.   L5-S1: Mild degenerative changes. Small left subarticular zone disc protrusion, as before, with impingement on the descending left S1 nerve root. Overall moderate left subarticular zone stenosis. No significant central zone stenosis. Mild right subarticular zone stenosis. No significant neural foraminal stenosis.   Xray R hip 08/05/21 Standing pelvis and right hip films taken and reviewed.  He  has  well-fixed, well-positioned bilateral hip replacement implants, right side  has a dual mobility articulation.  No grossly complicating features    R foot 12/11/21             FINDINGS: Screws throughout all 5 digits traversing the interphalangeal joints. Some fusion of the first digit interphalangeal joint noted. Ankle joint arthroplasty. There is no evidence of fracture or dislocation. No cortical erosion or destruction. Soft tissues are unremarkable.   IMPRESSION: Screws throughout all 5 digits traversing the interphalangeal joints.     R ankle xray 05/08/2019 report "appropriate maintenance of alignment and stable overall TAR construct, with no  evidence of hardware complication or prosthetic loosening.  Forefoot  reconstruction surgery is healing nicely with excellent alignment to the "     Assessment & Plan:   Chronic ankle pain in s/p multiple ankle surgeries -Continue meloxicam -Continue Tylenol as needed -UDS and pain contract completed -Continue oxycodone 5 mg up to 6 times a day as needed -Continue gabapentin 300 mg, dose limited by sedation.  Could consider Lyrica at a later time.  Cymbalta and amitriptyline also be options to consider at future visit. -PDMP reviewed -Mod opiod risk tool -Repeat UDS  -Narcan ordered for safety   Chronic lower back pain with lumbar spondylosis and left-sided radicular pain -Consider additional evaluation if pain worsens -Interventional procedures for his back being done at [facility].  Previously patient thought this was an ESI.  However appears he has had medial branch blocks done to L4-5, L5-S1 bilaterally, not sure if he has had RFA completed although he says he has follow-up in a few weeks with this group.  He will bring notes to next visit regarding both procedures he has completed  Hip pain greater on the right s/p bilateral hip replacements -Continue meloxicam

## 2022-06-02 LAB — TOXASSURE SELECT,+ANTIDEPR,UR

## 2022-06-23 ENCOUNTER — Encounter: Payer: Medicare Other | Attending: Physical Medicine & Rehabilitation | Admitting: Registered Nurse

## 2022-06-23 VITALS — BP 156/96 | HR 88 | Ht 70.0 in | Wt 171.8 lb

## 2022-06-23 DIAGNOSIS — M79671 Pain in right foot: Secondary | ICD-10-CM | POA: Insufficient documentation

## 2022-06-23 DIAGNOSIS — M25571 Pain in right ankle and joints of right foot: Secondary | ICD-10-CM | POA: Diagnosis present

## 2022-06-23 DIAGNOSIS — Z5181 Encounter for therapeutic drug level monitoring: Secondary | ICD-10-CM | POA: Insufficient documentation

## 2022-06-23 DIAGNOSIS — Z79891 Long term (current) use of opiate analgesic: Secondary | ICD-10-CM | POA: Diagnosis present

## 2022-06-23 DIAGNOSIS — M25551 Pain in right hip: Secondary | ICD-10-CM | POA: Diagnosis present

## 2022-06-23 DIAGNOSIS — G8929 Other chronic pain: Secondary | ICD-10-CM | POA: Diagnosis present

## 2022-06-23 DIAGNOSIS — M47816 Spondylosis without myelopathy or radiculopathy, lumbar region: Secondary | ICD-10-CM | POA: Insufficient documentation

## 2022-06-23 DIAGNOSIS — G894 Chronic pain syndrome: Secondary | ICD-10-CM | POA: Insufficient documentation

## 2022-06-23 MED ORDER — OXYCODONE HCL 5 MG PO TABS: 5.0000 mg | ORAL_TABLET | ORAL | 0 refills | Status: AC | PRN

## 2022-06-23 NOTE — Progress Notes (Signed)
Subjective:    Patient ID: Barry Beltran, male    DOB: 09-28-1975, 46 y.o.   MRN: [id number]  HPI: Barry Beltran is a 46 y.o. male who returns for follow up appointment for chronic pain and medication refill. He states his pain is located in his lower back, right hip, right ankle and right foot pain. He  rates his pain 8. His current exercise regime is walking   [name] Morphine equivalent is 45.00 MME.   Last UDS was Performed on 05/28/2022, it was consistent.     Pain Inventory Average Pain 7 Pain Right Now 8 My pain is sharp, burning, stabbing, tingling, and aching  In the last 24 hours, has pain interfered with the following? General activity 6 Relation with others 9 Enjoyment of life 8 What TIME of day is your pain at its worst? morning  and night Sleep (in general) Fair  Pain is worse with: walking and bending Pain improves with: rest, heat/ice, pacing activities, medication, and injections Relief from Meds: 6  Family History  Problem Relation Age of Onset   Hypertension Mother    Social History   Socioeconomic History   Marital status: Married    Spouse name: Not on file   Number of children: Not on file   Years of education: Not on file   Highest education level: Not on file  Occupational History   Not on file  Tobacco Use   Smoking status: Every Day    Packs/day: 0.80    Types: Cigarettes   Smokeless tobacco: Never  Substance and Sexual Activity   Alcohol use: Yes    Comment: rarely   Drug use: No   Sexual activity: Not on file  Other Topics Concern   Not on file  Social History Narrative   Not on file   Social Determinants of Health   Financial Resource Strain: Not on file  Food Insecurity: Not on file  Transportation Needs: Not on file  Physical Activity: Not on file  Stress: Not on file  Social Connections: Not on file   No past surgical history on file. No past surgical history on file. Past Medical History:  Diagnosis Date    Hypertension    BP (!) 156/96   Pulse 88   Ht 5\' 10"  (1.778 m)   Wt 171 lb 12.8 oz (77.9 kg)   SpO2 98%   BMI 24.65 kg/m   Opioid Risk Score:   Fall Risk Score:  `1  Depression screen [facility] 2/9     05/28/2022   10:00 AM 04/30/2022   10:05 AM 03/29/2022   10:51 AM  Depression screen PHQ 2/9  Decreased Interest 0 0 0  Down, Depressed, Hopeless 0 0 0  PHQ - 2 Score 0 0 0  Altered sleeping   2  Change in appetite   0  Feeling bad or failure about yourself    0  Trouble concentrating   0  Moving slowly or fidgety/restless   0  Suicidal thoughts   0  PHQ-9 Score   2  Difficult doing work/chores   Not difficult at all     Review of Systems  Musculoskeletal:  Positive for back pain.       Right knee pain Right ankle pain Left thigh pain  All other systems reviewed and are negative.     Objective:   Physical Exam Vitals and nursing note reviewed.  Constitutional:      Appearance: Normal appearance.  Cardiovascular:     Rate and Rhythm: Normal rate and regular rhythm.     Pulses: Normal pulses.     Heart sounds: Normal heart sounds.  Pulmonary:     Effort: Pulmonary effort is normal.     Breath sounds: Normal breath sounds.  Musculoskeletal:     Cervical back: Normal range of motion and neck supple.     Comments: Normal Muscle Bulk and Muscle Testing Reveals:  Upper Extremities: Full ROM and Muscle Strength 5/5  Lumbar Hypersensitivity Right Greater Trochanter Tenderness Lower Extremities: Right: Decreased ROM and Muscle Strength 5/5 Right Lower Extremity Flexion Produces Pain into his Lumbar, Right Hip and Right Patella Left Lower Extremity: Full ROM and Muscle Strength 5/5 Arises from Chair slowly Narrow Based  Gait     Skin:    General: Skin is warm and dry.  Neurological:     Mental Status: He is alert and oriented to person, place, and time.  Psychiatric:        Mood and Affect: Mood normal.        Behavior: Behavior normal.         Assessment &  Plan:  Lumbar Spondylosis: Continue HEP as Tolerated. Continue current medication regimen. Continue to monitor.  Chronic Right Hip Pain: Continue HEP as Tolerated. Continue current medication regimen. Continue to monitor.  Chronic Right Foot Pain: Continue HEP a Tolerated. Continue Current medication regimen. Continue to Monitor.  Chronic Right Ankle Pain: Continue HEP as Tolerated. Continue to Monitor.  Chronic Pain Syndrome: Refilled: Oxycodone 5 mg every 4 hours as needed for paoin #180. We will continue the opioid monitoring program, this consists of regular clinic visits, examinations, urine drug screen, pill counts as well as use of [place] Controlled Substance Reporting system. A 12 month History has been reviewed on the [place] Controlled Substance Reporting System on 06/23/2022.  F/U with Dr Benjie Karvonen

## 2022-06-24 ENCOUNTER — Telehealth: Payer: Self-pay | Admitting: *Deleted

## 2022-06-24 NOTE — Telephone Encounter (Signed)
Urine drug screen for this encounter is consistent for prescribed medication 

## 2022-06-30 ENCOUNTER — Encounter: Payer: Self-pay | Admitting: Registered Nurse

## 2022-07-22 ENCOUNTER — Telehealth: Payer: Self-pay | Admitting: Physical Medicine & Rehabilitation

## 2022-07-22 NOTE — Telephone Encounter (Signed)
Pain medication isn't helping patient.  Would like to discuss other options.  Please call patient at [phone].

## 2022-07-23 ENCOUNTER — Telehealth: Payer: Self-pay | Admitting: Physical Medicine & Rehabilitation

## 2022-07-23 NOTE — Telephone Encounter (Signed)
Pain medication not helping.  Needs to discuss what else he can do.

## 2022-07-23 NOTE — Telephone Encounter (Signed)
Called today to f/u on worsening pain. Pt reports his oxycodone not keeping pain as well controlled as it was few months ago.  Discussed options with patient.  He has clinic visit early next week and we will discuss treatment options further at that time.  Patient is agreeable to plan

## 2022-07-26 NOTE — Telephone Encounter (Signed)
Patient has a follow up tomorrow.

## 2022-07-27 ENCOUNTER — Encounter: Payer: Medicare Other | Attending: Physical Medicine & Rehabilitation | Admitting: Physical Medicine & Rehabilitation

## 2022-07-27 ENCOUNTER — Encounter: Payer: Self-pay | Admitting: Physical Medicine & Rehabilitation

## 2022-07-27 VITALS — BP 157/100 | HR 96 | Ht 71.0 in | Wt 173.0 lb

## 2022-07-27 DIAGNOSIS — Z79891 Long term (current) use of opiate analgesic: Secondary | ICD-10-CM | POA: Insufficient documentation

## 2022-07-27 DIAGNOSIS — M25551 Pain in right hip: Secondary | ICD-10-CM | POA: Diagnosis present

## 2022-07-27 DIAGNOSIS — G894 Chronic pain syndrome: Secondary | ICD-10-CM | POA: Diagnosis present

## 2022-07-27 DIAGNOSIS — M25552 Pain in left hip: Secondary | ICD-10-CM

## 2022-07-27 DIAGNOSIS — M47816 Spondylosis without myelopathy or radiculopathy, lumbar region: Secondary | ICD-10-CM | POA: Insufficient documentation

## 2022-07-27 DIAGNOSIS — G8929 Other chronic pain: Secondary | ICD-10-CM | POA: Diagnosis present

## 2022-07-27 DIAGNOSIS — M25571 Pain in right ankle and joints of right foot: Secondary | ICD-10-CM | POA: Diagnosis present

## 2022-07-27 MED ORDER — MELOXICAM 15 MG PO TABS: ORAL_TABLET | ORAL | 2 refills | Status: DC

## 2022-07-27 MED ORDER — GABAPENTIN 600 MG PO TABS: 600.0000 mg | ORAL_TABLET | Freq: Three times a day (TID) | ORAL | 2 refills | Status: DC

## 2022-07-27 MED ORDER — OXYCODONE HCL 5 MG PO TABS: 5.0000 mg | ORAL_TABLET | ORAL | 0 refills | Status: DC | PRN

## 2022-07-27 NOTE — Progress Notes (Signed)
Subjective:    Patient ID: Barry Beltran, male    DOB: Jan 15, 1976, 47 y.o.   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  Patient reports much of his pain is due to his prior surgeries.  Patient reports he had an ACL injury of his right knee which required surgery in 2015.  He then had a fusion of his right foot/ankle in 2016 that required repeat surgery in 2017.  He then had bone spurs removed from his right foot in 2017.  Reports he developed a required ankle replacement surgery in 2018 and then a Hallux valgus surgery March 2023.  Patient reports he developed left hip pain requiring replacement in 2021 and later right hip pain that was replaced shortly after.  He had a right hip dislocation and required an additional surgery for this.  Barry Beltran reports that his greatest pain in his right ankle and right hip.  He also has back pain due to herniated disks and reports a history of 18 epidural injections.  He reports occasional shooting pain down his left lower extremity.  Pain is constant sharp and burning.  He has had multiple rounds of physical therapy with some improvement.  To help relieve his pain he uses Tylenol 1 g 3 times a day.  He also takes meloxicam 15 mg, and gabapentin 300 mg 3 times daily.  Patient is also been getting oxycodone and occasionally tramadol.  He takes oxycodone about 5 mg 4-5 times a day and this helps to control his pain.  He reports this allows him to be more active at home and to walk much further.  Gabapentin dose is limited by sedation at higher doses.  He also wears a brace on his right foot often   Visit 05/31/22 Barry Beltran is here for follow-up of his chronic pain.  Pain continues to be worse in his right ankle.  Walking  continues to worsen the pain.  He continues to also have pain in his lower back will sometimes shoot down his left leg.  He also has pain in his hip on the right.  Patient reports that oxycodone 5 mg 6 times a day is keeping the pain under fairly good control.  And is allowing him to spend time with his family and be more active at home.  He says his pain did get worse a few weeks ago when he stopped using the THC Gummies.  He is not having any side effects the oxycodone.       Visit 05/29/2019 Barry Beltran is here for follow-up of his chronic pain.  He continues to have his worst pain in his right ankle.  This is worsened with ambulation.  He also is having pain in his lower back that shoots into his left thigh.  Oxycodone 5 mg 6 times a day as keeping the pain fairly well controlled.  He is not having any side effects with the medication.  He is not using THC Gummies anymore. Patient reports he is scheduled with [facility] to have repeat injections in his back.  He pulled he noted up on his phone where he had a  L4/5L 5/S1 medial brach block completed earlier this year.  He reports his procedure helped the  pain for over 6 months.  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    Interval history 04/27/2023 Barry Beltran is here for follow-up regarding his chronic pain.  He reports his foot and ankle pain has been worse recently.  His other areas of pain such as back and right hip are about the same.  He will occasionally have shooting pains on his left hip and thigh that worsens with activity.  He is not having this pain at this time.  He says he feels like his toenail on his right foot great toe is being squeezed or pulled off even though he no longer has the toenail here.  The oxycodone continues to help however does not last as long as it previously did.  He is not having any side effects with this medication.  He reports he is still following with [facility] and is  considering repeat of the medial branch blocks.  He also reports they were talking to him about a possible implantable device in his back, spinal cord stimulator?  He reports pain has been limiting his activities and he is not able to do as much as he previously was able to.  He continues to take gabapentin 600 mg twice daily.  It is not causing him any significant sedation or other side effects.  Reports TENS unit did not help previously.  Pain Inventory Average Pain 7 Pain Right Now 7 My pain is constant, sharp, burning, stabbing, tingling, and aching  In the last 24 hours, has pain interfered with the following? General activity 5 Relation with others 7 Enjoyment of life 7 What TIME of day is your pain at its worst? morning  and night Sleep (in general) Fair  Pain is worse with: walking, bending, standing, and some activites Pain improves with: rest, heat/ice, pacing activities, and medication Relief from Meds: 7  Family History  Problem Relation Age of Onset   Hypertension Mother    Social History   Socioeconomic History   Marital status: Married    Spouse name: Not on file   Number of children: Not on file   Years of education: Not on file   Highest education level: Not on file  Occupational History   Not on file  Tobacco Use   Smoking status: Every Day    Packs/day: 0.80    Types: Cigarettes   Smokeless tobacco: Never  Substance and Sexual Activity   Alcohol use: Yes    Comment: rarely   Drug use: No   Sexual activity: Not on file  Other Topics Concern   Not on file  Social History Narrative   Not on file   Social Determinants of Health   Financial Resource Strain: Not on file  Food Insecurity: Not on file  Transportation Needs: Not on file  Physical Activity: Not on file  Stress: Not on file  Social Connections: Not on file   No past surgical history on file. No past surgical history on file. Past Medical History:  Diagnosis Date   Hypertension     BP (!) 157/100   Pulse 96   Ht 5\' 11"  (1.803 m)   Wt 173 lb (78.5 kg)   SpO2 97%   BMI 24.13 kg/m   Opioid Risk Score:   Fall Risk Score:  `1  Depression screen Barry Beltran 2/9     05/28/2022   10:00 AM 04/30/2022   10:05 AM 03/29/2022   10:51 AM  Depression  screen PHQ 2/9  Decreased Interest 0 0 0  Down, Depressed, Hopeless 0 0 0  PHQ - 2 Score 0 0 0  Altered sleeping   2  Change in appetite   0  Feeling bad or failure about yourself    0  Trouble concentrating   0  Moving slowly or fidgety/restless   0  Suicidal thoughts   0  PHQ-9 Score   2  Difficult doing work/chores   Not difficult at all     Review of Systems  Musculoskeletal:  Positive for back pain.       Right hip pain Ankle and toe pain Left outer thigh pain  All other systems reviewed and are negative.     Objective:   Physical Exam   Gen: no distress, normal appearing HEENT: oral mucosa pink and moist, NCAT Chest: normal effort, normal rate of breathing Abd: soft, non-distended Ext: no edema Psych: pleasant, normal affect Skin: intact Neuro: Alert and oriented x4, follows commands, cranial nerves II through XII grossly intact, sensation intact light touch in all 4 extremities, strength 5 out of 5 in bilateral shoulder abduction, elbow flexion extension, finger flexion Strength 5 out of 5 in bilateral hip flexion, knee extension Strength 5 out of 5 left ankle PF and DF, strength 4 out of 5 right ankle PF and DF Sensation light touch intact in all 4 extremities Musculoskeletal:  Slump test negative bilaterally, facet loading negative, mild L-spine paraspinal tenderness Pain over the right greater trochanter No significant left hip or thigh pain with palpation Right ankle and midfoot and great toe tenderness  No abnormal tone       MRI L spine 09/17/21  FINDINGS: No significant interval change.   For the purposes of this dictation, it is assumed that the most caudal fully-segmented lumbar-type  vertebra is L5, and that the most caudal fully-developed intervertebral disc is L5-S1.   Degenerative disc disease (DDD) and facet arthropathy (see below).   Vertebral alignment: Slight retrolisthesis of L3 on L4, as before.   Marrow signal: No significant abnormality.  Vertebral body heights: Normal.  Conus medullaris: Terminates at a normal level. Normal in size and signal intensity.   Individual disc levels:   T12-L1: Normal.  L1-2: Normal.  L2-3: Normal.   L3-4: Degenerative changes, including slight retrolisthesis and a small left foraminal zone disc-osteophyte complex. Mild left neural foraminal stenosis. No significant spinal canal or right neural foraminal stenosis. Appearance similar to prior.   L4-5: Mild degenerative changes, including a small right foraminal zone disc-osteophyte complex. No significant central zone stenosis. Mild bilateral subarticular zone stenosis. Mild right neural foraminal stenosis. No significant left neural foraminal stenosis. Appearance similar to prior.   L5-S1: Mild degenerative changes. Small left subarticular zone disc protrusion, as before, with impingement on the descending left S1 nerve root. Overall moderate left subarticular zone stenosis. No significant central zone stenosis. Mild right subarticular zone stenosis. No significant neural foraminal stenosis.   Xray R hip 08/05/21 Standing pelvis and right hip films taken and reviewed.  He has  well-fixed, well-positioned bilateral hip replacement implants, right side  has a dual mobility articulation.  No grossly complicating features    R foot 12/11/21             FINDINGS: Screws throughout all 5 digits traversing the interphalangeal joints. Some fusion of the first digit interphalangeal joint noted. Ankle joint arthroplasty. There is no evidence of fracture or dislocation. No cortical erosion or destruction. Soft  tissues are unremarkable.   IMPRESSION: Screws throughout all 5 digits  traversing the interphalangeal joints.     R ankle xray 05/08/2019 report "appropriate maintenance of alignment and stable overall TAR construct, with no  evidence of hardware complication or prosthetic loosening.  Forefoot  reconstruction surgery is healing nicely with excellent alignment to the "       Assessment & Plan:   Chronic ankle pain in s/p multiple ankle surgeries -Continue meloxicam -Continue Tylenol as needed -UDS and pain contract completed -Continue oxycodone 5 mg up to 6 times a day as needed -Continue gabapentin 600 mg , increase dose from 2 to 3 times daily.  Could consider Lyrica at a later time.   -Cymbalta- and amitriptyline made him feel bad and  in the past.  -PDMP reviewed -Mod opiod risk tool -Repeat UDS  -Narcan ordered for safety   Chronic lower back pain with lumbar spondylosis and left-sided radicular pain -Consider additional evaluation if pain worsens -Interventional procedures for his back being done at [place].  Previously patient thought this was an ESI.  However appears he has had medial branch blocks done to L4-5, L5-S1 bilaterally, patient reports he has additional treatment planned at this group.  Initially it sounded like he was talking about discussion of a spinal cord stimulator?  However this may be in reference to RFA treatments.  Patient says he will let me know after he speaks with this group again.   Hip pain greater on the right s/p bilateral hip replacements -Continue meloxicam, ordered

## 2022-08-25 ENCOUNTER — Telehealth: Payer: Self-pay

## 2022-08-25 MED ORDER — OXYCODONE HCL 5 MG PO TABS: 5.0000 mg | ORAL_TABLET | ORAL | 0 refills | Status: DC | PRN

## 2022-08-25 NOTE — Telephone Encounter (Signed)
Patient requesting refill on oxycodone last fill per pmp 07/29/22   Filled  Written  ID  Drug  QTY  Days  Prescriber  RX #  Dispenser  Refill  Daily Dose*  Pymt Type  PMP  07/29/2022 07/27/2022 1  Oxycodone Hcl (Ir) 5 Mg Tablet 180.00 Datil [id number] Nor ([id number]) 0/0 45.00 MME Private Pay [place]

## 2022-09-20 ENCOUNTER — Other Ambulatory Visit: Payer: Self-pay | Admitting: *Deleted

## 2022-09-20 ENCOUNTER — Telehealth: Payer: Self-pay | Admitting: *Deleted

## 2022-09-20 ENCOUNTER — Other Ambulatory Visit: Payer: Self-pay | Admitting: Physical Medicine & Rehabilitation

## 2022-09-20 MED ORDER — GABAPENTIN 600 MG PO TABS: 600.0000 mg | ORAL_TABLET | Freq: Three times a day (TID) | ORAL | 2 refills | Status: DC

## 2022-09-20 MED ORDER — OXYCODONE HCL 5 MG PO TABS: 5.0000 mg | ORAL_TABLET | ORAL | 0 refills | Status: DC | PRN

## 2022-09-20 NOTE — Telephone Encounter (Signed)
PMP was Reviewed.  Dr Marciano Sequin note was reviewed.  Oxycodone e-scribed to pharmacy.  Call placed to Barry Beltran regarding the above, he was also instructed his Oxycodone is not due to be filled today, last fill date was 08/25/2022, he verbalizes understanding.

## 2022-09-20 NOTE — Telephone Encounter (Signed)
Patient calling requesting refills on Hydrocodone and Gabapentin [facility] [address]. I still don't have access to PMP.

## 2022-09-20 NOTE — Telephone Encounter (Signed)
Pt notified rx sent this afternoon.

## 2022-09-28 ENCOUNTER — Encounter: Payer: Self-pay | Admitting: Physical Medicine & Rehabilitation

## 2022-09-28 ENCOUNTER — Encounter: Payer: Medicare Other | Attending: Physical Medicine & Rehabilitation | Admitting: Physical Medicine & Rehabilitation

## 2022-09-28 VITALS — BP 148/97 | HR 97 | Ht 71.0 in | Wt 183.0 lb

## 2022-09-28 DIAGNOSIS — G894 Chronic pain syndrome: Secondary | ICD-10-CM | POA: Insufficient documentation

## 2022-09-28 DIAGNOSIS — Z5181 Encounter for therapeutic drug level monitoring: Secondary | ICD-10-CM | POA: Insufficient documentation

## 2022-09-28 DIAGNOSIS — Z79891 Long term (current) use of opiate analgesic: Secondary | ICD-10-CM | POA: Diagnosis present

## 2022-09-28 NOTE — Progress Notes (Signed)
Subjective:    Patient ID: Barry Beltran, male    DOB: 1975/11/02, 47 y.o.   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  Patient reports much of his pain is due to his prior surgeries.  Patient reports he had an ACL injury of his right knee which required surgery in 2015.  He then had a fusion of his right foot/ankle in 2016 that required repeat surgery in 2017.  He then had bone spurs removed from his right foot in 2017.  Reports he developed a required ankle replacement surgery in 2018 and then a Hallux valgus surgery March 2023.  Patient reports he developed left hip pain requiring replacement in 2021 and later right hip pain that was replaced shortly after.  He had a right hip dislocation and required an additional surgery for this.  Barry Beltran reports that his greatest pain in his right ankle and right hip.  He also has back pain due to herniated disks and reports a history of 18 epidural injections.  He reports occasional shooting pain down his left lower extremity.  Pain is constant sharp and burning.  He has had multiple rounds of physical therapy with some improvement.  To help relieve his pain he uses Tylenol 1 g 3 times a day.  He also takes meloxicam 15 mg, and gabapentin 300 mg 3 times daily.  Patient is also been getting oxycodone and occasionally tramadol.  He takes oxycodone about 5 mg 4-5 times a day and this helps to control his pain.  He reports this allows him to be more active at home and to walk much further.  Gabapentin dose is limited by sedation at higher doses.  He also wears a brace on his right foot often   Visit 05/31/22 Barry Beltran is here for follow-up of his chronic pain.  Pain continues to be worse in his right ankle.  Walking  continues to worsen the pain.  He continues to also have pain in his lower back will sometimes shoot down his left leg.  He also has pain in his hip on the right.  Patient reports that oxycodone 5 mg 6 times a day is keeping the pain under fairly good control.  And is allowing him to spend time with his family and be more active at home.  He says his pain did get worse a few weeks ago when he stopped using the [place].  He is not having any side effects the oxycodone.       Visit 05/29/2019 Barry Beltran is here for follow-up of his chronic pain.  He continues to have his worst pain in his right ankle.  This is worsened with ambulation.  He also is having pain in his lower back that shoots into his left thigh.  Oxycodone 5 mg 6 times a day as keeping the pain fairly well controlled.  He is not having any side effects with the medication.  He is not using THC Gummies anymore. Patient reports he is scheduled with [facility] to have repeat injections in his back.  He pulled he noted up on his phone where he had a  L4/5L 5/S1 medial brach block completed earlier this year.  He reports his procedure helped the pain  for over 6 months.  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    Interval history 07/27/2022 Barry Beltran is here for follow-up regarding his chronic pain.  He reports his foot and ankle pain has been worse recently.  His other areas of pain such as back and right hip are about the same.  He will occasionally have shooting pains on his left hip and thigh that worsens with activity.  He is not having this pain at this time.  He says he feels like his toenail on his right foot great toe is being squeezed or pulled off even though he no longer has the toenail here.  The oxycodone continues to help however does not last as long as it previously did.  He is not having any side effects with this medication.  He reports he is still following with [facility] and is  considering repeat of the medial branch blocks.  He also reports they were talking to him about a possible implantable device in his back, spinal cord stimulator?  He reports pain has been limiting his activities and he is not able to do as much as he previously was able to.  He continues to take gabapentin 600 mg twice daily.  It is not causing him any significant sedation or other side effects.  Reports TENS unit did not help previously.   Interval history 09/28/22 Barry Beltran is here for follow-up regarding treatment of his chronic pain.  Patient reports since the increase gabapentin the uncomfortable sensation around his skin has improved.  Oxycodone continues to help him with his pain, however not quite as well as it did at the beginning.  Overall pain is controlled and he is able to function better with the medication.  Medication helps him spend better quality time with his family.  He is also taking several classes regarding his employment, and plans to travel to [place] shortly to take a class.  Patient reports he had a nerve ablation for his lower back completed, reports some improvement with this treatment.    Pain Inventory Average Pain 7 Pain Right Now 6 My pain is sharp, burning, dull, stabbing, tingling, and aching  In the last 24 hours, has pain interfered with the following? General activity 5 Relation with others 1 Enjoyment of life 1 What TIME of day is your pain at its worst? evening and night Sleep (in general) Fair  Pain is worse with: walking, bending, standing, and some activites Pain improves with: rest, heat/ice, therapy/exercise, medication, and injections Relief from Meds: 7  Family History  Problem Relation Age of Onset   Hypertension Mother    Social History   Socioeconomic History   Marital status: Married    Spouse name: Not on file   Number of children: Not on file   Years of education: Not on file   Highest education level: Not on file  Occupational  History   Not on file  Tobacco Use   Smoking status: Every Day    Packs/day: .8    Types: Cigarettes   Smokeless tobacco: Never  Substance and Sexual Activity   Alcohol use: Yes    Comment: rarely   Drug use: No   Sexual activity: Not on file  Other Topics Concern   Not on file  Social History Narrative   Not on file   Social Determinants of Health   Financial Resource Strain: Not on file  Food Insecurity: Not on  file  Transportation Needs: Not on file  Physical Activity: Not on file  Stress: Not on file  Social Connections: Not on file   No past surgical history on file. No past surgical history on file. Past Medical History:  Diagnosis Date   Hypertension    There were no vitals taken for this visit.  Opioid Risk Score:   Fall Risk Score:  `1  Depression screen [facility] 2/9     05/28/2022   10:00 AM 04/30/2022   10:05 AM 03/29/2022   10:51 AM  Depression screen PHQ 2/9  Decreased Interest 0 0 0  Down, Depressed, Hopeless 0 0 0  PHQ - 2 Score 0 0 0  Altered sleeping   2  Change in appetite   0  Feeling bad or failure about yourself    0  Trouble concentrating   0  Moving slowly or fidgety/restless   0  Suicidal thoughts   0  PHQ-9 Score   2  Difficult doing work/chores   Not difficult at all      Review of Systems  Musculoskeletal:  Positive for back pain and gait problem.       B/L hip pain   All other systems reviewed and are negative.     Objective:   Physical Exam   Gen: no distress, normal appearing HEENT: oral mucosa pink and moist, NCAT Chest: normal effort, normal rate of breathing Abd: soft, non-distended Ext: no edema Psych: pleasant, normal affect Skin: intact Neuro: Alert and oriented x4, follows commands, cranial nerves II through XII grossly intact,  strength 5 out of 5 in b/l UE Strength 5 out of 5 in bilateral hip flexion, knee extension Strength 5 out of 5 left ankle PF and DF, strength 4 out of 5 right ankle PF and  DF Sensation light touch intact in all 4 extremities Musculoskeletal:  Slump test negative bilaterally, facet loading negative, positive L-spine paraspinal tenderness-reports still sore after "nerve ablation procedure.  Pain over the right greater trochanter Right ankle and midfoot tenderness with palpation No abnormal tone       MRI L spine 09/17/21  FINDINGS: No significant interval change.   For the purposes of this dictation, it is assumed that the most caudal fully-segmented lumbar-type vertebra is L5, and that the most caudal fully-developed intervertebral disc is L5-S1.   Degenerative disc disease (DDD) and facet arthropathy (see below).   Vertebral alignment: Slight retrolisthesis of L3 on L4, as before.   Marrow signal: No significant abnormality.  Vertebral body heights: Normal.  Conus medullaris: Terminates at a normal level. Normal in size and signal intensity.   Individual disc levels:   T12-L1: Normal.  L1-2: Normal.  L2-3: Normal.   L3-4: Degenerative changes, including slight retrolisthesis and a small left foraminal zone disc-osteophyte complex. Mild left neural foraminal stenosis. No significant spinal canal or right neural foraminal stenosis. Appearance similar to prior.   L4-5: Mild degenerative changes, including a small right foraminal zone disc-osteophyte complex. No significant central zone stenosis. Mild bilateral subarticular zone stenosis. Mild right neural foraminal stenosis. No significant left neural foraminal stenosis. Appearance similar to prior.   L5-S1: Mild degenerative changes. Small left subarticular zone disc protrusion, as before, with impingement on the descending left S1 nerve root. Overall moderate left subarticular zone stenosis. No significant central zone stenosis. Mild right subarticular zone stenosis. No significant neural foraminal stenosis.   Xray R hip 08/05/21 Standing pelvis and right hip films taken and reviewed.  He  has   well-fixed, well-positioned bilateral hip replacement implants, right side  has a dual mobility articulation.  No grossly complicating features    R foot 12/11/21             FINDINGS: Screws throughout all 5 digits traversing the interphalangeal joints. Some fusion of the first digit interphalangeal joint noted. Ankle joint arthroplasty. There is no evidence of fracture or dislocation. No cortical erosion or destruction. Soft tissues are unremarkable.   IMPRESSION: Screws throughout all 5 digits traversing the interphalangeal joints.     R ankle xray 05/08/2019 report "appropriate maintenance of alignment and stable overall TAR construct, with no  evidence of hardware complication or prosthetic loosening.  Forefoot  reconstruction surgery is healing nicely with excellent alignment to the "        Assessment & Plan:  Chronic ankle pain in s/p multiple ankle surgeries -Continue meloxicam -Continue Tylenol as needed -UDS and pain contract completed prior visit -Continue oxycodone 5 mg up to 6 times a day as needed -Continue gabapentin 600 mg  3 times daily.  Reports benefit with increase of gabapentin. Could consider Lyrica at a later time.   -Cymbalta- and amitriptyline made him feel bad and  in the past.  -PDMP reviewed -Mod opiod risk tool -Repeat UDS today -Narcan ordered for safety previously    Chronic lower back pain with lumbar spondylosis and left-sided radicular pain -Interventional procedures for his back being done at [facility].  Previously patient thought this was an ESI.  However appears he has had medial branch blocks done to L4-5, L5-S1 bilaterally, patient reports he has additional treatment planned at this group.  Initially it sounded like he was talking about discussion of a spinal cord stimulator?  Pt the nerve ablation was completed on 3/7 with benefit.    Hip pain greater on the right s/p bilateral hip replacements -Continue meloxicam

## 2022-10-01 LAB — TOXASSURE SELECT,+ANTIDEPR,UR

## 2022-10-15 ENCOUNTER — Telehealth: Payer: Self-pay | Admitting: *Deleted

## 2022-10-15 NOTE — Telephone Encounter (Signed)
Urine drug screen for this encounter is consistent for prescribed medication 

## 2022-10-18 ENCOUNTER — Other Ambulatory Visit: Payer: Self-pay | Admitting: Physical Medicine & Rehabilitation

## 2022-10-18 ENCOUNTER — Other Ambulatory Visit: Payer: Self-pay | Admitting: *Deleted

## 2022-10-19 MED ORDER — OXYCODONE HCL 5 MG PO TABS: 5.0000 mg | ORAL_TABLET | ORAL | 0 refills | Status: DC | PRN

## 2022-11-12 ENCOUNTER — Other Ambulatory Visit: Payer: Self-pay | Admitting: Physical Medicine & Rehabilitation

## 2022-11-15 ENCOUNTER — Telehealth: Payer: Self-pay

## 2022-11-15 NOTE — Telephone Encounter (Signed)
Refill request for Gabapentin & Oxycodone   Filled  Written  ID  Drug  QTY  Days  Prescriber  RX #  Dispenser  Refill  Daily Dose*  Pymt Type  PMP  10/19/2022 10/19/2022 1  Oxycodone Hcl (Ir) 5 Mg Tablet 180.00 30 Corie Chiquito [id number] Nor ([id number]) 0/0 45.00 MME Private Pay  10/18/2022 09/20/2022 2  Gabapentin 600 Mg Tablet 90.00 30 Eu Tho [id number] Nor ([id number]) 0/2 2.01 LME Medicare

## 2022-11-16 ENCOUNTER — Other Ambulatory Visit: Payer: Self-pay | Admitting: Physical Medicine & Rehabilitation

## 2022-11-16 MED ORDER — OXYCODONE HCL 5 MG PO TABS: 5.0000 mg | ORAL_TABLET | ORAL | 0 refills | Status: DC | PRN

## 2022-11-25 ENCOUNTER — Encounter: Payer: Medicare Other | Attending: Physical Medicine & Rehabilitation | Admitting: Physical Medicine & Rehabilitation

## 2022-11-25 ENCOUNTER — Encounter: Payer: Self-pay | Admitting: Physical Medicine & Rehabilitation

## 2022-11-25 VITALS — BP 156/92 | HR 78 | Ht 71.0 in | Wt 181.8 lb

## 2022-11-25 DIAGNOSIS — I10 Essential (primary) hypertension: Secondary | ICD-10-CM | POA: Diagnosis not present

## 2022-11-25 DIAGNOSIS — M25571 Pain in right ankle and joints of right foot: Secondary | ICD-10-CM | POA: Insufficient documentation

## 2022-11-25 DIAGNOSIS — G894 Chronic pain syndrome: Secondary | ICD-10-CM | POA: Insufficient documentation

## 2022-11-25 DIAGNOSIS — M25562 Pain in left knee: Secondary | ICD-10-CM | POA: Insufficient documentation

## 2022-11-25 NOTE — Progress Notes (Signed)
Subjective:    Patient ID: Barry Beltran, male    DOB: [birth year], 47 y.o.   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  Patient reports much of his pain is due to his prior surgeries.  Patient reports he had an ACL injury of his right knee which required surgery in 2015.  He then had a fusion of his right foot/ankle in 2016 that required repeat surgery in 2017.  He then had bone spurs removed from his right foot in 2017.  Reports he developed a required ankle replacement surgery in 2018 and then a Hallux valgus surgery March 2023.  Patient reports he developed left hip pain requiring replacement in 2021 and later right hip pain that was replaced shortly after.  He had a right hip dislocation and required an additional surgery for this.  Barry Beltran reports that his greatest pain in his right ankle and right hip.  He also has back pain due to herniated disks and reports a history of 18 epidural injections.  He reports occasional shooting pain down his left lower extremity.  Pain is constant sharp and burning.  He has had multiple rounds of physical therapy with some improvement.  To help relieve his pain he uses Tylenol 1 g 3 times a day.  He also takes meloxicam 15 mg, and gabapentin 300 mg 3 times daily.  Patient is also been getting oxycodone and occasionally tramadol.  He takes oxycodone about 5 mg 4-5 times a day and this helps to control his pain.  He reports this allows him to be more active at home and to walk much further.  Gabapentin dose is limited by sedation at higher doses.  He also wears a brace on his right foot often   Visit 05/31/22 Barry Beltran is here for follow-up of his chronic pain.  Pain continues to be worse in his right ankle.  Walking continues to  worsen the pain.  He continues to also have pain in his lower back will sometimes shoot down his left leg.  He also has pain in his hip on the right.  Patient reports that oxycodone 5 mg 6 times a day is keeping the pain under fairly good control.  And is allowing him to spend time with his family and be more active at home.  He says his pain did get worse a few weeks ago when he stopped using the THC Gummies.  He is not having any side effects the oxycodone.       Visit 05/29/2019 Barry Beltran is here for follow-up of his chronic pain.  He continues to have his worst pain in his right ankle.  This is worsened with ambulation.  He also is having pain in his lower back that shoots into his left thigh.  Oxycodone 5 mg 6 times a day as keeping the pain fairly well controlled.  He is not having any side effects with the medication.  He is not using THC Gummies anymore. Patient reports he is scheduled with [facility] to have repeat injections in his back.  He pulled he noted up on his phone where he had a  L4/5L 5/S1 medial brach block completed earlier this year.  He reports his procedure helped the pain for  over 6 months.  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    Interval history 07/27/2022 Barry Beltran is here for follow-up regarding his chronic pain.  He reports his foot and ankle pain has been worse recently.  His other areas of pain such as back and right hip are about the same.  He will occasionally have shooting pains on his left hip and thigh that worsens with activity.  He is not having this pain at this time.  He says he feels like his toenail on his right foot great toe is being squeezed or pulled off even though he no longer has the toenail here.  The oxycodone continues to help however does not last as long as it previously did.  He is not having any side effects with this medication.  He reports he is still following with [facility] and is considering  repeat of the medial branch blocks.  He also reports they were talking to him about a possible implantable device in his back, spinal cord stimulator?  He reports pain has been limiting his activities and he is not able to do as much as he previously was able to.  He continues to take gabapentin 600 mg twice daily.  It is not causing him any significant sedation or other side effects.  Reports TENS unit did not help previously.     Interval history 09/28/22 Barry Beltran is here for follow-up regarding treatment of his chronic pain.  Patient reports since the increase gabapentin the uncomfortable sensation around his skin has improved.  Oxycodone continues to help him with his pain, however not quite as well as it did at the beginning.  Overall pain is controlled and he is able to function better with the medication.  Medication helps him spend better quality time with his family.  He is also taking several classes regarding his employment, and plans to travel to [place] shortly to take a class.  Patient reports he had a nerve ablation for his lower back completed, reports some improvement with this treatment.    Interval History 11/25/22 Barry Beltran is here for follow-up regarding his back hip and ankle pain.  Oxycodone continues to keep his pain under control.  It is not as strong as it used to be however he does report that he is doing more than he used to.  He is able to spend time with his kids, exercise more, work on gun smithing and do more around the house.  Gabapentin continues to help his nerve pain at his incision sites.  He denies any side effects with medications.  He has been having some increased pain in his left knee for the past several weeks.  Pain Inventory Average Pain 7 Pain Right Now 7 My pain is constant, sharp, burning, dull, stabbing, tingling, and aching  In the last 24 hours, has pain interfered with the following? General activity 5 Relation with others 1 Enjoyment of life  2 What TIME of day is your pain at its worst? morning , evening, and night Sleep (in general) Fair  Pain is worse with: walking, bending, and inactivity Pain improves with: heat/ice, pacing activities, medication, and injections Relief from Meds: 9  Family History  Problem Relation Age of Onset   Hypertension Mother    Social History   Socioeconomic History   Marital status: Married    Spouse name: Not on file   Number of children: Not on file  Years of education: Not on file   Highest education level: Not on file  Occupational History   Not on file  Tobacco Use   Smoking status: Every Day    Packs/day: .8    Types: Cigarettes   Smokeless tobacco: Never  Substance and Sexual Activity   Alcohol use: Yes    Comment: rarely   Drug use: No   Sexual activity: Not on file  Other Topics Concern   Not on file  Social History Narrative   Not on file   Social Determinants of Health   Financial Resource Strain: Not on file  Food Insecurity: Not on file  Transportation Needs: Not on file  Physical Activity: Not on file  Stress: Not on file  Social Connections: Not on file   No past surgical history on file. No past surgical history on file. Past Medical History:  Diagnosis Date   Hypertension    BP (!) 156/92   Pulse 78   Ht 5\' 11"  (1.803 m)   Wt 181 lb 12.8 oz (82.5 kg)   SpO2 96%   BMI 25.36 kg/m   Opioid Risk Score:   Fall Risk Score:  `1  Depression screen [facility] 2/9     11/25/2022    9:22 AM 09/28/2022    9:29 AM 05/28/2022   10:00 AM 04/30/2022   10:05 AM 03/29/2022   10:51 AM  Depression screen PHQ 2/9  Decreased Interest 0 0 0 0 0  Down, Depressed, Hopeless 0 0 0 0 0  PHQ - 2 Score 0 0 0 0 0  Altered sleeping     2  Change in appetite     0  Feeling bad or failure about yourself      0  Trouble concentrating     0  Moving slowly or fidgety/restless     0  Suicidal thoughts     0  PHQ-9 Score     2  Difficult doing work/chores     Not difficult  at all     Review of Systems  Constitutional: Negative.   HENT: Negative.    Eyes: Negative.   Respiratory: Negative.    Cardiovascular: Negative.   Gastrointestinal: Negative.   Endocrine: Negative.   Genitourinary: Negative.   Musculoskeletal:  Positive for arthralgias and back pain.  Skin: Negative.   Allergic/Immunologic: Negative.   Neurological: Negative.   Hematological: Negative.   Psychiatric/Behavioral: Negative.    All other systems reviewed and are negative.      Objective:   Physical Exam   Gen: no distress, normal appearing HEENT: oral mucosa pink and moist, NCAT Chest: normal effort, normal rate of breathing Abd: soft, non-distended Ext: no edema Psych: pleasant, normal affect Skin: intact Neuro: Alert and oriented x4, follows commands, cranial nerves II through XII grossly intact,  strength 5 out of 5 in b/l UE Strength 5 out of 5 in bilateral hip flexion, knee extension Strength 5 out of 5 left ankle PF and DF, strength 4 out of 5 right ankle PF and DF Sensation light touch intact in all 4 extremities Musculoskeletal:  Slump test negative bilaterally, facet loading negative, positive L-spine paraspinal tenderness-reports still sore after "nerve ablation procedure.  Pain over the right greater trochanter Right ankle and midfoot tenderness with palpation No abnormal tone Left knee medial joint line tenderness, pain with varus and valgus stress, anterior and posterior drawer negative McMurray negative     MRI L spine 09/17/21  FINDINGS: No significant  interval change.   For the purposes of this dictation, it is assumed that the most caudal fully-segmented lumbar-type vertebra is L5, and that the most caudal fully-developed intervertebral disc is L5-S1.   Degenerative disc disease (DDD) and facet arthropathy (see below).   Vertebral alignment: Slight retrolisthesis of L3 on L4, as before.   Marrow signal: No significant abnormality.  Vertebral body  heights: Normal.  Conus medullaris: Terminates at a normal level. Normal in size and signal intensity.   Individual disc levels:   T12-L1: Normal.  L1-2: Normal.  L2-3: Normal.   L3-4: Degenerative changes, including slight retrolisthesis and a small left foraminal zone disc-osteophyte complex. Mild left neural foraminal stenosis. No significant spinal canal or right neural foraminal stenosis. Appearance similar to prior.   L4-5: Mild degenerative changes, including a small right foraminal zone disc-osteophyte complex. No significant central zone stenosis. Mild bilateral subarticular zone stenosis. Mild right neural foraminal stenosis. No significant left neural foraminal stenosis. Appearance similar to prior.   L5-S1: Mild degenerative changes. Small left subarticular zone disc protrusion, as before, with impingement on the descending left S1 nerve root. Overall moderate left subarticular zone stenosis. No significant central zone stenosis. Mild right subarticular zone stenosis. No significant neural foraminal stenosis.   Xray R hip 08/05/21 Standing pelvis and right hip films taken and reviewed.  He has  well-fixed, well-positioned bilateral hip replacement implants, right side  has a dual mobility articulation.  No grossly complicating features    R foot 12/11/21             FINDINGS: Screws throughout all 5 digits traversing the interphalangeal joints. Some fusion of the first digit interphalangeal joint noted. Ankle joint arthroplasty. There is no evidence of fracture or dislocation. No cortical erosion or destruction. Soft tissues are unremarkable.   IMPRESSION: Screws throughout all 5 digits traversing the interphalangeal joints.     R ankle xray 05/08/2019 report "appropriate maintenance of alignment and stable overall TAR construct, with no  evidence of hardware complication or prosthetic loosening.  Forefoot  reconstruction surgery is healing nicely with excellent alignment  to the "        Assessment & Plan:   Chronic ankle pain in s/p multiple ankle surgeries -He reports pain is controlled, continue current regimen -Continue meloxicam -Continue Tylenol as needed -UDS and pain contract completed prior visit -Continue oxycodone 5 mg up to 6 times a day as needed -Continue gabapentin 600 mg  3 times daily.  Reports benefit with increase of gabapentin. Could consider Lyrica at a later time.   -Cymbalta- and amitriptyline made him feel bad and  in the past.  -PDMP reviewed -Mod opiod risk tool -Repeat UDS today -Continue pill counts- few tabs low, reports these are in 4 day pill organizer at home, discussed bringing this tomorrow  -Narcan ordered for safety previously    Chronic lower back pain with lumbar spondylosis and left-sided radicular pain -Interventional procedures for his back being done at [facility].  Previously patient thought this was an ESI.  However appears he has had medial branch blocks done to L4-5, L5-S1 bilaterally, patient reports he has additional treatment planned at this group.  Initially it sounded like he was talking about discussion of a spinal cord stimulator?  Pt the nerve ablation was completed on 3/7 with benefit.    Hip pain greater on the right s/p bilateral hip replacements -Continue meloxicam  Left knee pain, suspect OA -X-ray ordered -Consider cortisone injection if he does  not do this with his orthopedic doctor or primary care before next visit   HTN -F/u with PCP

## 2022-12-13 ENCOUNTER — Encounter: Payer: Self-pay | Admitting: Physical Medicine & Rehabilitation

## 2022-12-14 ENCOUNTER — Other Ambulatory Visit: Payer: Self-pay | Admitting: Physical Medicine & Rehabilitation

## 2022-12-14 MED ORDER — OXYCODONE HCL 5 MG PO TABS: 5.0000 mg | ORAL_TABLET | ORAL | 0 refills | Status: DC | PRN

## 2022-12-14 NOTE — Telephone Encounter (Signed)
PMP was Reviewed.  Dr Benjie Karvonen note was Reviewed UDS was Reviewed.  Oxycodone e-scribed today.  Call placed to Mr. Busbin, no answer. Left message to return the call.

## 2023-01-08 ENCOUNTER — Other Ambulatory Visit: Payer: Self-pay | Admitting: Physical Medicine & Rehabilitation

## 2023-01-11 ENCOUNTER — Encounter: Payer: Self-pay | Admitting: Physical Medicine & Rehabilitation

## 2023-01-11 NOTE — Telephone Encounter (Signed)
2nd call received requesting refill on oxycodone. Last filled by PMP 12/14/22.

## 2023-01-12 ENCOUNTER — Other Ambulatory Visit: Payer: Self-pay | Admitting: Physical Medicine & Rehabilitation

## 2023-01-12 MED ORDER — OXYCODONE HCL 5 MG PO TABS: 5.0000 mg | ORAL_TABLET | ORAL | 0 refills | Status: DC | PRN

## 2023-01-12 NOTE — Telephone Encounter (Signed)
You have not sent in Mr Barry Beltran's rx for his oxycodone and he has called 2x about this. It was filled 12/14/22 and is due to be refilled.

## 2023-01-25 ENCOUNTER — Encounter: Payer: Self-pay | Admitting: Physical Medicine & Rehabilitation

## 2023-01-25 ENCOUNTER — Encounter: Payer: Medicare Other | Attending: Physical Medicine & Rehabilitation | Admitting: Physical Medicine & Rehabilitation

## 2023-01-25 VITALS — BP 177/107 | HR 84 | Ht 71.0 in | Wt 170.0 lb

## 2023-01-25 DIAGNOSIS — G8929 Other chronic pain: Secondary | ICD-10-CM | POA: Insufficient documentation

## 2023-01-25 DIAGNOSIS — M25571 Pain in right ankle and joints of right foot: Secondary | ICD-10-CM

## 2023-01-25 DIAGNOSIS — M1712 Unilateral primary osteoarthritis, left knee: Secondary | ICD-10-CM | POA: Diagnosis present

## 2023-01-25 DIAGNOSIS — Z79891 Long term (current) use of opiate analgesic: Secondary | ICD-10-CM | POA: Diagnosis present

## 2023-01-25 DIAGNOSIS — G894 Chronic pain syndrome: Secondary | ICD-10-CM | POA: Diagnosis present

## 2023-01-25 DIAGNOSIS — M47816 Spondylosis without myelopathy or radiculopathy, lumbar region: Secondary | ICD-10-CM | POA: Diagnosis present

## 2023-01-25 MED ORDER — OXYCODONE HCL 5 MG PO TABS: 5.0000 mg | ORAL_TABLET | ORAL | 0 refills | Status: DC | PRN

## 2023-01-25 MED ORDER — GABAPENTIN 800 MG PO TABS: 800.0000 mg | ORAL_TABLET | Freq: Three times a day (TID) | ORAL | 3 refills | Status: DC

## 2023-01-25 NOTE — Progress Notes (Addendum)
 Subjective:    Patient ID: Barry Beltran, male    DOB: 05-24-1976, 47 y.o.   MRN: [id number]  HPI  HPI   Mr. 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  Patient reports much of his pain is due to his prior surgeries.  Patient reports he had an ACL injury of his right knee which required surgery in 2015.  He then had a fusion of his right foot/ankle in 2016 that required repeat surgery in 2017.  He then had bone spurs removed from his right foot in 2017.  Reports he developed a required ankle replacement surgery in 2018 and then a Hallux valgus surgery March 2023.  Patient reports he developed left hip pain requiring replacement in 2021 and later right hip pain that was replaced shortly after.  He had a right hip dislocation and required an additional surgery for this.  Mr. Barry Beltran reports that his greatest pain in his right ankle and right hip.  He also has back pain due to herniated disks and reports a history of 18 epidural injections.  He reports occasional shooting pain down his left lower extremity.  Pain is constant sharp and burning.  He has had multiple rounds of physical therapy with some improvement.  To help relieve his pain he uses Tylenol 1 g 3 times a day.  He also takes meloxicam 15 mg, and gabapentin 300 mg 3 times daily.  Patient is also been getting oxycodone and occasionally tramadol.  He takes oxycodone about 5 mg 4-5 times a day and this helps to control his pain.  He reports this allows him to be more active at home and to walk much further.  Gabapentin dose is limited by sedation at higher doses.  He also wears a brace on his right foot often   Visit 05/31/22 [name] is here for follow-up of his chronic pain.  Pain continues to be worse in his right ankle.  Walking  continues to worsen the pain.  He continues to also have pain in his lower back will sometimes shoot down his left leg.  He also has pain in his hip on the right.  Patient reports that oxycodone 5 mg 6 times a day is keeping the pain under fairly good control.  And is allowing him to spend time with his family and be more active at home.  He says his pain did get worse a few weeks ago when he stopped using the THC Gummies.  He is not having any side effects the oxycodone.       Visit 05/29/2019 Mr. Barry Beltran is here for follow-up of his chronic pain.  He continues to have his worst pain in his right ankle.  This is worsened with ambulation.  He also is having pain in his lower back that shoots into his left thigh.  Oxycodone 5 mg 6 times a day as keeping the pain fairly well controlled.  He is not having any side effects with the medication.  He is not using THC Gummies anymore. Patient reports he is scheduled with [facility] to have repeat injections in his back.  He pulled he noted up on his phone where he had a  L4/5L 5/S1 medial brach block completed earlier this year.  He reports his procedure helped  the pain for over 6 months.  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    Interval history 07/27/2022 Mr. Barry Beltran is here for follow-up regarding his chronic pain.  He reports his foot and ankle pain has been worse recently.  His other areas of pain such as back and right hip are about the same.  He will occasionally have shooting pains on his left hip and thigh that worsens with activity.  He is not having this pain at this time.  He says he feels like his toenail on his right foot great toe is being squeezed or pulled off even though he no longer has the toenail here.  The oxycodone continues to help however does not last as long as it previously did.  He is not having any side effects with this medication.  He reports he is still following with [facility] and is  considering repeat of the medial branch blocks.  He also reports they were talking to him about a possible implantable device in his back, spinal cord stimulator?  He reports pain has been limiting his activities and he is not able to do as much as he previously was able to.  He continues to take gabapentin 600 mg twice daily.  It is not causing him any significant sedation or other side effects.  Reports TENS unit did not help previously.     Interval history 09/28/22 Mr. Barry Beltran is here for follow-up regarding treatment of his chronic pain.  Patient reports since the increase gabapentin the uncomfortable sensation around his skin has improved.  Oxycodone continues to help him with his pain, however not quite as well as it did at the beginning.  Overall pain is controlled and he is able to function better with the medication.  Medication helps him spend better quality time with his family.  He is also taking several classes regarding his employment, and plans to travel to [place] shortly to take a class.  Patient reports he had a nerve ablation for his lower back completed, reports some improvement with this treatment.     Interval History 11/25/22 Mr. Barry Beltran is here for follow-up regarding his back hip and ankle pain.  Oxycodone continues to keep his pain under control.  It is not as strong as it used to be however he does report that he is doing more than he used to.  He is able to spend time with his kids, exercise more, work on gun smithing and do more around the house.  Gabapentin continues to help his nerve pain at his incision sites.  He denies any side effects with medications.  He has been having some increased pain in his left knee for the past several weeks.  Interval History 01/25/23 Mr. Barry Beltran is here for follow-up regarding his chronic pain.  He reports that his hip has been a little better recently.  He continues to have back pain and right ankle pain.  He has also been having a lot of pain in his  left knee.  He had a x-ray completed by Ortho over his left knee.  Patient uploaded images of this x-ray and it does appear to show OA.  He had cortisone injection completed by Ortho 12/03/2022.  The cortisone injection left knee helped for about a month but knee pain has returned.  Knee pain is worse with activity.  Although oxycodone is not helping as much as it used to, he still feels that  it provides benefit and helps keep his pain tolerable.  He also feels that gabapentin reducing his nerve pain, however pain continues to be uncomfortable at times.  No side effects of medications.  Pain Inventory Average Pain 7 Pain Right Now 6 My pain is sharp, burning, stabbing, tingling, and aching  In the last 24 hours, has pain interfered with the following? General activity 5 Relation with others 7 Enjoyment of life 7 What TIME of day is your pain at its worst? daytime and evening Sleep (in general) Fair  Pain is worse with: walking, bending, and standing Pain improves with: rest, heat/ice, medication, and injections Relief from Meds: 7  Family History  Problem Relation Age of Onset   Hypertension Mother    Social History   Socioeconomic History   Marital status: Married    Spouse name: Not on file   Number of children: Not on file   Years of education: Not on file   Highest education level: Not on file  Occupational History   Not on file  Tobacco Use   Smoking status: Every Day    Current packs/day: 0.80    Types: Cigarettes   Smokeless tobacco: Never  Vaping Use   Vaping status: Not on file  Substance and Sexual Activity   Alcohol use: Yes    Comment: rarely   Drug use: No   Sexual activity: Not on file  Other Topics Concern   Not on file  Social History Narrative   Not on file   Social Determinants of Health   Financial Resource Strain: Patient Declined (08/09/2022)   Received from [facility], [facility]   Overall Financial Resource Strain (CARDIA)    Difficulty  of Paying Living Expenses: Patient declined  Food Insecurity: Low Risk  (01/24/2023)   Received from [facility]   Food vital sign    Within the past 12 months, you worried that your food would run out before you got money to buy more: Never true    Within the past 12 months, the food you bought just didn't last and you didn't have money to get more. : Never true  Transportation Needs: Not on file (01/24/2023)  Physical Activity: Sufficiently Active (08/09/2022)   Received from [facility], [facility]   Exercise Vital Sign    Days of Exercise per Week: 7 days    Minutes of Exercise per Session: 60 min  Stress: No Stress Concern Present (08/09/2022)   Received from [facility], [facility] of Occupational Health - Occupational Stress Questionnaire    Feeling of Stress : Not at all  Recent Concern: Stress - Stress Concern Present (05/18/2022)   Received from [facility] of Occupational Health - Occupational Stress Questionnaire    Feeling of Stress : To some extent  Social Connections: Socially Integrated (08/09/2022)   Received from [facility], [facility]   Social Network    How would you rate your social network (family, work, friends)?: Good participation with social networks   History reviewed. No pertinent surgical history. History reviewed. No pertinent surgical history. Past Medical History:  Diagnosis Date   Hypertension    BP (!) 159/107   Pulse 84   Ht 5\' 11"  (1.803 m)   Wt 170 lb (77.1 kg)   SpO2 98%   BMI 23.71 kg/m   Opioid Risk Score:   Fall Risk Score:  `1  Depression screen [facility] 2/9  01/25/2023    9:33 AM 11/25/2022    9:22 AM 09/28/2022    9:29 AM 05/28/2022   10:00 AM 04/30/2022   10:05 AM 03/29/2022   10:51 AM  Depression screen PHQ 2/9  Decreased Interest 0 0 0 0 0 0  Down, Depressed, Hopeless 0 0 0 0 0 0  PHQ - 2 Score 0 0 0 0 0 0  Altered sleeping      2  Change in appetite      0  Feeling  bad or failure about yourself       0  Trouble concentrating      0  Moving slowly or fidgety/restless      0  Suicidal thoughts      0  PHQ-9 Score      2  Difficult doing work/chores      Not difficult at all    Review of Systems  Musculoskeletal:        Right foot pain, right hip pain, left knee pain  All other systems reviewed and are negative.     Objective:   Physical Exam   Gen: no distress, normal appearing HEENT: oral mucosa pink and moist, NCAT Chest: normal effort, normal rate of breathing Abd: soft, non-distended Ext: no edema Psych: pleasant, normal affect Skin: intact Neuro: Alert and oriented x4, follows commands, cranial nerves II through XII grossly intact,  strength 5 out of 5 in b/l UE Strength 5 out of 5 in bilateral hip flexion, knee extension Strength 5 out of 5 left ankle PF and DF, strength 4 out of 5 right ankle PF and DF Sensation light touch intact in all 4 extremities Musculoskeletal:  Slump test negative bilaterally, facet loading negative, positive L-spine paraspinal tenderness-unchanged Pain over the right greater trochanter- improved Right ankle and midfoot tenderness with palpation No abnormal tone Left knee medial joint line tenderness, pain with varus and valgus stress, anterior and posterior drawer negative McMurray negative     MRI L spine 09/17/21  FINDINGS: No significant interval change.   For the purposes of this dictation, it is assumed that the most caudal fully-segmented lumbar-type vertebra is L5, and that the most caudal fully-developed intervertebral disc is L5-S1.   Degenerative disc disease (DDD) and facet arthropathy (see below).   Vertebral alignment: Slight retrolisthesis of L3 on L4, as before.   Marrow signal: No significant abnormality.  Vertebral body heights: Normal.  Conus medullaris: Terminates at a normal level. Normal in size and signal intensity.   Individual disc levels:   T12-L1: Normal.  L1-2:  Normal.  L2-3: Normal.   L3-4: Degenerative changes, including slight retrolisthesis and a small left foraminal zone disc-osteophyte complex. Mild left neural foraminal stenosis. No significant spinal canal or right neural foraminal stenosis. Appearance similar to prior.   L4-5: Mild degenerative changes, including a small right foraminal zone disc-osteophyte complex. No significant central zone stenosis. Mild bilateral subarticular zone stenosis. Mild right neural foraminal stenosis. No significant left neural foraminal stenosis. Appearance similar to prior.   L5-S1: Mild degenerative changes. Small left subarticular zone disc protrusion, as before, with impingement on the descending left S1 nerve root. Overall moderate left subarticular zone stenosis. No significant central zone stenosis. Mild right subarticular zone stenosis. No significant neural foraminal stenosis.   Xray R hip 08/05/21 Standing pelvis and right hip films taken and reviewed.  He has  well-fixed, well-positioned bilateral hip replacement implants, right side  has a dual mobility articulation.  No grossly  complicating features    R foot 12/11/21             FINDINGS: Screws throughout all 5 digits traversing the interphalangeal joints. Some fusion of the first digit interphalangeal joint noted. Ankle joint arthroplasty. There is no evidence of fracture or dislocation. No cortical erosion or destruction. Soft tissues are unremarkable.   IMPRESSION: Screws throughout all 5 digits traversing the interphalangeal joints.     R ankle xray 05/08/2019 report "appropriate maintenance of alignment and stable overall TAR construct, with no  evidence of hardware complication or prosthetic loosening.  Forefoot  reconstruction surgery is healing nicely with excellent alignment to the "        Assessment & Plan:   Chronic ankle pain in s/p multiple ankle surgeries -Continue meloxicam -Continue Tylenol as needed -UDS and pain  contract completed prior visit -Continue oxycodone 5 mg up to 6 times a day as needed -Increase gabapentin to 800mg  TID -Cymbalta- and amitriptyline made him feel bad and  in the past.  -PDMP reviewed -Mod opiod risk tool -Due to monitor UDS, pill counts, PDMP -Continue pill counts-appears consistent -Narcan ordered for safety previously  -TENS unit did help -continue   Chronic lower back pain with lumbar spondylosis and left-sided radicular pain -Interventional procedures for his back being done at [facility].  Previously patient thought this was an ESI.  However appears he has had medial branch blocks done to L4-5, L5-S1 bilaterally, patient reports he has additional treatment planned at this group.  Initially it sounded like he was talking about discussion of a spinal cord stimulator?  Pt the nerve ablation was completed on 3/7 with benefit. -Continue treatment as above   Hip pain greater on the right s/p bilateral hip replacements -Continue meloxicam   Left knee pain, suspect OA -Appears patient completed x-ray with Ortho, he did have a picture of this that he uploaded and it does appear to show OA to the medial compartment -Patient had cortisone injection, will ask for office to check on price for gel injection-we will not schedule injection today per patient request -Discussed knee brace, he patient says he has 1 at home that his sister is bothering and he will try this again.  If this is not beneficial we could try ordering knee brace offloading from [employer]   HTN -F/u with PCP recommended  3/21 partial RX for oxycodone, pt has had to reschedule due to flu and covid, will provide 2 weeks to get him to next visit

## 2023-01-31 ENCOUNTER — Encounter: Payer: Self-pay | Admitting: Physical Medicine & Rehabilitation

## 2023-01-31 ENCOUNTER — Telehealth: Payer: Self-pay | Admitting: *Deleted

## 2023-01-31 MED ORDER — OXYCODONE HCL 5 MG PO TABS: 5.0000 mg | ORAL_TABLET | ORAL | 0 refills | Status: DC | PRN

## 2023-01-31 NOTE — Telephone Encounter (Signed)
Please reorder Oxycodone 5 mg change date 02/04/23

## 2023-01-31 NOTE — Addendum Note (Signed)
Addended by: Fanny Dance on: 01/31/2023 03:49 PM   Modules accepted: Orders

## 2023-02-01 ENCOUNTER — Encounter: Payer: Self-pay | Admitting: Physical Medicine & Rehabilitation

## 2023-02-02 ENCOUNTER — Other Ambulatory Visit: Payer: Self-pay | Admitting: Physical Medicine & Rehabilitation

## 2023-02-08 ENCOUNTER — Encounter: Payer: Self-pay | Admitting: Physical Medicine & Rehabilitation

## 2023-02-08 MED ORDER — OXYCODONE HCL 5 MG PO TABS: 5.0000 mg | ORAL_TABLET | ORAL | 0 refills | Status: DC | PRN

## 2023-02-08 MED ORDER — MELOXICAM 15 MG PO TABS: 15.0000 mg | ORAL_TABLET | Freq: Every day | ORAL | 0 refills | Status: DC

## 2023-02-09 ENCOUNTER — Encounter: Payer: Self-pay | Admitting: Physical Medicine & Rehabilitation

## 2023-02-10 MED ORDER — MELOXICAM 15 MG PO TABS: 15.0000 mg | ORAL_TABLET | Freq: Every day | ORAL | 0 refills | Status: DC

## 2023-02-10 MED ORDER — OXYCODONE HCL 5 MG PO TABS: 5.0000 mg | ORAL_TABLET | ORAL | 0 refills | Status: DC | PRN

## 2023-02-11 ENCOUNTER — Telehealth: Payer: Self-pay | Admitting: Physical Medicine & Rehabilitation

## 2023-02-11 NOTE — Telephone Encounter (Signed)
Needing further information for prescription Oxyocodone [id number]

## 2023-03-10 ENCOUNTER — Encounter: Payer: Self-pay | Admitting: Physical Medicine & Rehabilitation

## 2023-03-13 ENCOUNTER — Other Ambulatory Visit: Payer: Self-pay | Admitting: Physical Medicine & Rehabilitation

## 2023-03-22 MED ORDER — OXYCODONE HCL 5 MG PO TABS: 5.0000 mg | ORAL_TABLET | ORAL | 0 refills | Status: DC | PRN

## 2023-03-28 ENCOUNTER — Ambulatory Visit: Payer: Medicare Other | Admitting: Physical Medicine & Rehabilitation

## 2023-04-07 ENCOUNTER — Encounter: Payer: Medicare Other | Admitting: Physical Medicine & Rehabilitation

## 2023-04-08 ENCOUNTER — Other Ambulatory Visit: Payer: Self-pay | Admitting: Physical Medicine & Rehabilitation

## 2023-04-21 NOTE — Progress Notes (Signed)
Subjective:    Patient ID: Barry Beltran, male    DOB: 08/25/1975, 47 y.o.   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  Patient reports much of his pain is due to his prior surgeries.  Patient reports he had an ACL injury of his right knee which required surgery in 2015.  He then had a fusion of his right foot/ankle in 2016 that required repeat surgery in 2017.  He then had bone spurs removed from his right foot in 2017.  Reports he developed a required ankle replacement surgery in 2018 and then a Hallux valgus surgery March 2023.  Patient reports he developed left hip pain requiring replacement in 2021 and later right hip pain that was replaced shortly after.  He had a right hip dislocation and required an additional surgery for this.  Barry Beltran reports that his greatest pain in his right ankle and right hip.  He also has back pain due to herniated disks and reports a history of 18 epidural injections.  He reports occasional shooting pain down his left lower extremity.  Pain is constant sharp and burning.  He has had multiple rounds of physical therapy with some improvement.  To help relieve his pain he uses Tylenol 1 g 3 times a day.  He also takes meloxicam 15 mg, and gabapentin 300 mg 3 times daily.  Patient is also been getting oxycodone and occasionally tramadol.  He takes oxycodone about 5 mg 4-5 times a day and this helps to control his pain.  He reports this allows him to be more active at home and to walk much further.  Gabapentin dose is limited by sedation at higher doses.  He also wears a brace on his right foot often   Visit 05/31/22 Mr. Fischel is here for follow-up of his chronic pain.  Pain continues to be worse in his right ankle.  Walking  continues to worsen the pain.  He continues to also have pain in his lower back will sometimes shoot down his left leg.  He also has pain in his hip on the right.  Patient reports that oxycodone 5 mg 6 times a day is keeping the pain under fairly good control.  And is allowing him to spend time with his family and be more active at home.  He says his pain did get worse a few weeks ago when he stopped using the THC Gummies.  He is not having any side effects the oxycodone.       Visit 05/29/2019 Barry Beltran is here for follow-up of his chronic pain.  He continues to have his worst pain in his right ankle.  This is worsened with ambulation.  He also is having pain in his lower back that shoots into his left thigh.  Oxycodone 5 mg 6 times a day as keeping the pain fairly well controlled.  He is not having any side effects with the medication.  He is not using THC Gummies anymore. Patient reports he is scheduled with [facility] to have repeat injections in his back.  He pulled he noted up on his phone where he had a  L4/5L 5/S1 medial brach block completed earlier this year.  He reports his procedure helped  the pain for over 6 months.  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    Interval history 07/27/2022 Barry Beltran is here for follow-up regarding his chronic pain.  He reports his foot and ankle pain has been worse recently.  His other areas of pain such as back and right hip are about the same.  He will occasionally have shooting pains on his left hip and thigh that worsens with activity.  He is not having this pain at this time.  He says he feels like his toenail on his right foot great toe is being squeezed or pulled off even though he no longer has the toenail here.  The oxycodone continues to help however does not last as long as it previously did.  He is not having any side effects with this medication.  He reports he is still following with [facility] and is  considering repeat of the medial branch blocks.  He also reports they were talking to him about a possible implantable device in his back, spinal cord stimulator?  He reports pain has been limiting his activities and he is not able to do as much as he previously was able to.  He continues to take gabapentin 600 mg twice daily.  It is not causing him any significant sedation or other side effects.  Reports TENS unit did not help previously.     Interval history 09/28/22 Barry Beltran is here for follow-up regarding treatment of his chronic pain.  Patient reports since the increase gabapentin the uncomfortable sensation around his skin has improved.  Oxycodone continues to help him with his pain, however not quite as well as it did at the beginning.  Overall pain is controlled and he is able to function better with the medication.  Medication helps him spend better quality time with his family.  He is also taking several classes regarding his employment, and plans to travel to [place] shortly to take a class.  Patient reports he had a nerve ablation for his lower back completed, reports some improvement with this treatment.     Interval History 11/25/22 Barry Beltran is here for follow-up regarding his back hip and ankle pain.  Oxycodone continues to keep his pain under control.  It is not as strong as it used to be however he does report that he is doing more than he used to.  He is able to spend time with his kids, exercise more, work on gun smithing and do more around the house.  Gabapentin continues to help his nerve pain at his incision sites.  He denies any side effects with medications.  He has been having some increased pain in his left knee for the past several weeks.  Interval History 01/25/23 Barry Beltran is here for follow-up regarding his chronic pain.  He reports that his hip has been a little better recently.  He continues to have back pain and right ankle pain.  He has also been having a lot of pain in his  left knee.  He had a x-ray completed by Ortho over his left knee.  Patient uploaded images of this x-ray and it does appear to show OA.  He had cortisone injection completed by Ortho 12/03/2022.  The cortisone injection left knee helped for about a month but knee pain has returned.  Knee pain is worse with activity.  Although oxycodone is not helping as much as it used to, he still feels that  it provides benefit and helps keep his pain tolerable.  He also feels that gabapentin reducing his nerve pain, however pain continues to be uncomfortable at times.  No side effects of medications.  Interval History 04/22/23 Barry Beltran is here for follow-up regarding his chronic pain in multiple areas.  He reports lately his knee has been hurting a lot.  Pain relief with cortisone did not last long enough, he is interested in trying viscosupplementation.  Pain in other areas such as his right ankle and lower back is unchanged from prior visit.  He continues to use oxycodone 5 mg for pain control.  This provides benefit and does not cause any side effects.   Pain Inventory Average Pain 7 Pain Right Now 6 My pain is sharp, burning, stabbing, tingling, and aching  In the last 24 hours, has pain interfered with the following? General activity 5 Relation with others 7 Enjoyment of life 7 What TIME of day is your pain at its worst? daytime and evening Sleep (in general) Fair  Pain is worse with: walking, bending, and standing Pain improves with: rest, heat/ice, medication, and injections Relief from Meds: 7  Family History  Problem Relation Age of Onset   Hypertension Mother    Social History   Socioeconomic History   Marital status: Married    Spouse name: Not on file   Number of children: Not on file   Years of education: Not on file   Highest education level: Not on file  Occupational History   Not on file  Tobacco Use   Smoking status: Every Day    Current packs/day: 0.80    Types: Cigarettes    Smokeless tobacco: Never  Vaping Use   Vaping status: Not on file  Substance and Sexual Activity   Alcohol use: Yes    Comment: rarely   Drug use: No   Sexual activity: Not on file  Other Topics Concern   Not on file  Social History Narrative   Not on file   Social Determinants of Health   Financial Resource Strain: Patient Declined (08/09/2022)   Received from [facility], [facility]   Overall Financial Resource Strain (CARDIA)    Difficulty of Paying Living Expenses: Patient declined  Food Insecurity: Low Risk  (01/24/2023)   [facility]   Hunger Vital Sign    Worried About Running Out of Food in the Last Year: Never true    Ran Out of Food in the Last Year: Never true  Transportation Needs: Not on file (01/24/2023)  Physical Activity: Sufficiently Active (08/09/2022)   Received from [facility], [facility]   Exercise Vital Sign    Days of Exercise per Week: 7 days    Minutes of Exercise per Session: 60 min  Stress: No Stress Concern Present (08/09/2022)   Received from [facility], [facility] - Occupational Stress Questionnaire    Feeling of Stress : Not at all  Recent Concern: Stress - Stress Concern Present (05/18/2022)   Received from [facility] of [facility] Health - Occupational Stress Questionnaire    Feeling of Stress : To some extent  Social Connections: Socially Integrated (08/09/2022)   Received from [facility], [facility]   Social Network    How would you rate your social network (family, work, friends)?: Good participation with social networks   No past surgical history on file. No past surgical history on  file. Past Medical History:  Diagnosis Date   Hypertension    There were no vitals taken for this visit.  Opioid Risk Score:   Fall Risk Score:  `1  Depression screen [facility] 2/9     01/25/2023    9:33 AM 11/25/2022    9:22 AM 09/28/2022     9:29 AM 05/28/2022   10:00 AM 04/30/2022   10:05 AM 03/29/2022   10:51 AM  Depression screen PHQ 2/9  Decreased Interest 0 0 0 0 0 0  Down, Depressed, Hopeless 0 0 0 0 0 0  PHQ - 2 Score 0 0 0 0 0 0  Altered sleeping      2  Change in appetite      0  Feeling bad or failure about yourself       0  Trouble concentrating      0  Moving slowly or fidgety/restless      0  Suicidal thoughts      0  PHQ-9 Score      2  Difficult doing work/chores      Not difficult at all    Review of Systems  Musculoskeletal:        Right foot pain, right hip pain, left knee pain  All other systems reviewed and are negative.      Objective:   Physical Exam     04/22/2023   11:11 AM 01/25/2023    9:35 AM 01/25/2023    9:26 AM  Vitals with BMI  Height 5\' 11"   5\' 11"   Weight   170 lbs  BMI   23.72  Systolic 134 177 347  Diastolic 89 107 107  Pulse 89  84     Gen: no distress, normal appearing HEENT: oral mucosa pink and moist, NCAT Chest: normal effort, normal rate of breathing Abd: soft, non-distended Ext: no edema Psych: pleasant, normal affect Skin: intact Neuro: Alert and oriented x4, follows commands, cranial nerves II through XII grossly intact,  strength 5 out of 5 in b/l UE Strength 5 out of 5 in bilateral hip flexion, knee extension Strength 5 out of 5 left ankle PF and DF, strength 4 out of 5 right ankle PF and DF Sensation light touch intact in all 4 extremities Musculoskeletal:  Slump test negative bilaterally, facet loading positive L-spine paraspinal tenderness Pain over the right greater trochanter Right ankle and midfoot tenderness with palpation Left knee joint line tenderness present, no ligamentous instability      MRI L spine 09/17/21  FINDINGS: No significant interval change.   For the purposes of this dictation, it is assumed that the most caudal fully-segmented lumbar-type vertebra is L5, and that the most caudal fully-developed intervertebral disc is  L5-S1.   Degenerative disc disease (DDD) and facet arthropathy (see below).   Vertebral alignment: Slight retrolisthesis of L3 on L4, as before.   Marrow signal: No significant abnormality.  Vertebral body heights: Normal.  Conus medullaris: Terminates at a normal level. Normal in size and signal intensity.   Individual disc levels:   T12-L1: Normal.  L1-2: Normal.  L2-3: Normal.   L3-4: Degenerative changes, including slight retrolisthesis and a small left foraminal zone disc-osteophyte complex. Mild left neural foraminal stenosis. No significant spinal canal or right neural foraminal stenosis. Appearance similar to prior.   L4-5: Mild degenerative changes, including a small right foraminal zone disc-osteophyte complex. No significant central zone stenosis. Mild bilateral subarticular zone stenosis. Mild right neural foraminal stenosis. No significant  left neural foraminal stenosis. Appearance similar to prior.   L5-S1: Mild degenerative changes. Small left subarticular zone disc protrusion, as before, with impingement on the descending left S1 nerve root. Overall moderate left subarticular zone stenosis. No significant central zone stenosis. Mild right subarticular zone stenosis. No significant neural foraminal stenosis.   Xray R hip 08/05/21 Standing pelvis and right hip films taken and reviewed.  He has  well-fixed, well-positioned bilateral hip replacement implants, right side  has a dual mobility articulation.  No grossly complicating features    R foot 12/11/21             FINDINGS: Screws throughout all 5 digits traversing the interphalangeal joints. Some fusion of the first digit interphalangeal joint noted. Ankle joint arthroplasty. There is no evidence of fracture or dislocation. No cortical erosion or destruction. Soft tissues are unremarkable.   IMPRESSION: Screws throughout all 5 digits traversing the interphalangeal joints.     R ankle xray 05/08/2019  report "appropriate maintenance of alignment and stable overall TAR construct, with no  evidence of hardware complication or prosthetic loosening.  Forefoot  reconstruction surgery is healing nicely with excellent alignment to the "        Assessment & Plan:   Chronic ankle pain in s/p multiple ankle surgeries -Continue meloxicam current dose -Continue Tylenol as needed -UDS and pain contract completed prior visit -Continue oxycodone 5 mg up to 6 times a day as needed-ordered -Continue gabapentin to 800mg  TID -Cymbalta- and amitriptyline made him feel bad and  in the past.  -Mod opiod risk tool -Monitor UDS, pill counts, PDMP -Narcan ordered for safety previously  -TENS unit did help -continue   Chronic lower back pain with lumbar spondylosis and left-sided radicular pain -Interventional procedures for his back being done at [facility] - he gets Facet joint L spine nerve ablations. -Continue treatment as above   Hip pain greater on the right s/p bilateral hip replacements -Continue meloxicam   Left knee pain, suspect OA -Appears patient completed x-ray with Ortho, he did have a picture of this that he uploaded and it does appear to show OA to the medial compartment -Will schedule for Knee joint viscosupplementation, will ask staff to check on insurance coverage/cost -Zilretta would be an alternative medication to try   HTN, improved  -F/u with PCP

## 2023-04-22 ENCOUNTER — Encounter: Payer: Self-pay | Admitting: Physical Medicine & Rehabilitation

## 2023-04-22 ENCOUNTER — Encounter: Payer: Medicare Other | Attending: Physical Medicine & Rehabilitation | Admitting: Physical Medicine & Rehabilitation

## 2023-04-22 VITALS — BP 134/89 | HR 89 | Ht 71.0 in

## 2023-04-22 DIAGNOSIS — Z5181 Encounter for therapeutic drug level monitoring: Secondary | ICD-10-CM | POA: Insufficient documentation

## 2023-04-22 DIAGNOSIS — Z79891 Long term (current) use of opiate analgesic: Secondary | ICD-10-CM | POA: Insufficient documentation

## 2023-04-22 DIAGNOSIS — G894 Chronic pain syndrome: Secondary | ICD-10-CM | POA: Diagnosis present

## 2023-04-22 MED ORDER — OXYCODONE HCL 5 MG PO TABS: 5.0000 mg | ORAL_TABLET | ORAL | 0 refills | Status: DC | PRN

## 2023-04-27 LAB — TOXASSURE SELECT,+ANTIDEPR,UR

## 2023-05-17 ENCOUNTER — Other Ambulatory Visit: Payer: Self-pay | Admitting: Physical Medicine & Rehabilitation

## 2023-05-20 ENCOUNTER — Encounter: Payer: Medicare Other | Admitting: Physical Medicine & Rehabilitation

## 2023-06-03 ENCOUNTER — Other Ambulatory Visit: Payer: Self-pay | Admitting: Physical Medicine & Rehabilitation

## 2023-06-04 ENCOUNTER — Other Ambulatory Visit: Payer: Self-pay | Admitting: Physical Medicine & Rehabilitation

## 2023-06-06 ENCOUNTER — Other Ambulatory Visit: Payer: Self-pay | Admitting: Physical Medicine & Rehabilitation

## 2023-06-06 ENCOUNTER — Encounter: Payer: Self-pay | Admitting: Physical Medicine & Rehabilitation

## 2023-06-06 MED ORDER — OXYCODONE HCL 5 MG PO TABS: 5.0000 mg | ORAL_TABLET | ORAL | 0 refills | Status: DC | PRN

## 2023-06-06 MED ORDER — MELOXICAM 15 MG PO TABS: 15.0000 mg | ORAL_TABLET | Freq: Every day | ORAL | 3 refills | Status: DC

## 2023-06-20 ENCOUNTER — Encounter: Payer: Self-pay | Admitting: Physical Medicine & Rehabilitation

## 2023-06-20 ENCOUNTER — Encounter: Payer: Medicare Other | Attending: Physical Medicine & Rehabilitation | Admitting: Physical Medicine & Rehabilitation

## 2023-06-20 VITALS — BP 116/73 | HR 85 | Ht 71.0 in | Wt 174.8 lb

## 2023-06-20 DIAGNOSIS — M1712 Unilateral primary osteoarthritis, left knee: Secondary | ICD-10-CM | POA: Diagnosis not present

## 2023-06-20 DIAGNOSIS — G8929 Other chronic pain: Secondary | ICD-10-CM | POA: Diagnosis not present

## 2023-06-20 DIAGNOSIS — M47816 Spondylosis without myelopathy or radiculopathy, lumbar region: Secondary | ICD-10-CM

## 2023-06-20 DIAGNOSIS — Z5181 Encounter for therapeutic drug level monitoring: Secondary | ICD-10-CM | POA: Diagnosis present

## 2023-06-20 DIAGNOSIS — Z79891 Long term (current) use of opiate analgesic: Secondary | ICD-10-CM | POA: Diagnosis present

## 2023-06-20 DIAGNOSIS — M25571 Pain in right ankle and joints of right foot: Secondary | ICD-10-CM | POA: Insufficient documentation

## 2023-06-20 MED ORDER — OXYCODONE HCL 5 MG PO TABS: 5.0000 mg | ORAL_TABLET | ORAL | 0 refills | Status: DC | PRN

## 2023-06-20 NOTE — Progress Notes (Signed)
Subjective:    Patient ID: Barry Beltran, male    DOB: 01-May-1976, 47 y.o.   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  Patient reports much of his pain is due to his prior surgeries.  Patient reports he had an ACL injury of his right knee which required surgery in 2015.  He then had a fusion of his right foot/ankle in 2016 that required repeat surgery in 2017.  He then had bone spurs removed from his right foot in 2017.  Reports he developed a required ankle replacement surgery in 2018 and then a Hallux valgus surgery March 2023.  Patient reports he developed left hip pain requiring replacement in 2021 and later right hip pain that was replaced shortly after.  He had a right hip dislocation and required an additional surgery for this.  Barry Beltran reports that his greatest pain in his right ankle and right hip.  He also has back pain due to herniated disks and reports a history of 18 epidural injections.  He reports occasional shooting pain down his left lower extremity.  Pain is constant sharp and burning.  He has had multiple rounds of physical therapy with some improvement.  To help relieve his pain he uses Tylenol 1 g 3 times a day.  He also takes meloxicam 15 mg, and gabapentin 300 mg 3 times daily.  Patient is also been getting oxycodone and occasionally tramadol.  He takes oxycodone about 5 mg 4-5 times a day and this helps to control his pain.  He reports this allows him to be more active at home and to walk much further.  Gabapentin dose is limited by sedation at higher doses.  He also wears a brace on his right foot often   Visit 05/31/22 Barry Beltran is here for follow-up of his chronic pain.  Pain continues to be worse in his right ankle.  Walking  continues to worsen the pain.  He continues to also have pain in his lower back will sometimes shoot down his left leg.  He also has pain in his hip on the right.  Patient reports that oxycodone 5 mg 6 times a day is keeping the pain under fairly good control.  And is allowing him to spend time with his family and be more active at home.  He says his pain did get worse a few weeks ago when he stopped using the THC Gummies.  He is not having any side effects the oxycodone.       Visit 05/29/2019 Barry Beltran is here for follow-up of his chronic pain.  He continues to have his worst pain in his right ankle.  This is worsened with ambulation.  He also is having pain in his lower back that shoots into his left thigh.  Oxycodone 5 mg 6 times a day as keeping the pain fairly well controlled.  He is not having any side effects with the medication.  He is not using THC Gummies anymore. Patient reports he is scheduled with [facility] to have repeat injections in his back.  He pulled he noted up on his phone where he had a  L4/5L 5/S1 medial brach block completed earlier this year.  He reports his procedure helped  the pain for over 6 months.  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    Interval history 07/27/2022 Barry Beltran is here for follow-up regarding his chronic pain.  He reports his foot and ankle pain has been worse recently.  His other areas of pain such as back and right hip are about the same.  He will occasionally have shooting pains on his left hip and thigh that worsens with activity.  He is not having this pain at this time.  He says he feels like his toenail on his right foot great toe is being squeezed or pulled off even though he no longer has the toenail here.  The oxycodone continues to help however does not last as long as it previously did.  He is not having any side effects with this medication.  He reports he is still following with [facility] and is  considering repeat of the medial branch blocks.  He also reports they were talking to him about a possible implantable device in his back, spinal cord stimulator?  He reports pain has been limiting his activities and he is not able to do as much as he previously was able to.  He continues to take gabapentin 600 mg twice daily.  It is not causing him any significant sedation or other side effects.  Reports TENS unit did not help previously.     Interval history 09/28/22 Barry Beltran is here for follow-up regarding treatment of his chronic pain.  Patient reports since the increase gabapentin the uncomfortable sensation around his skin has improved.  Oxycodone continues to help him with his pain, however not quite as well as it did at the beginning.  Overall pain is controlled and he is able to function better with the medication.  Medication helps him spend better quality time with his family.  He is also taking several classes regarding his employment, and plans to travel to [place] shortly to take a class.  Patient reports he had a nerve ablation for his lower back completed, reports some improvement with this treatment.     Interval History 11/25/22 Barry Beltran is here for follow-up regarding his back hip and ankle pain.  Oxycodone continues to keep his pain under control.  It is not as strong as it used to be however he does report that he is doing more than he used to.  He is able to spend time with his kids, exercise more, work on gun smithing and do more around the house.  Gabapentin continues to help his nerve pain at his incision sites.  He denies any side effects with medications.  He has been having some increased pain in his left knee for the past several weeks.  Interval History 01/25/23 Barry Beltran is here for follow-up regarding his chronic pain.  He reports that his hip has been a little better recently.  He continues to have back pain and right ankle pain.  He has also been having a lot of pain in his  left knee.  He had a x-ray completed by Ortho over his left knee.  Patient uploaded images of this x-ray and it does appear to show OA.  He had cortisone injection completed by Ortho 12/03/2022.  The cortisone injection left knee helped for about a month but knee pain has returned.  Knee pain is worse with activity.  Although oxycodone is not helping as much as it used to, he still feels that  it provides benefit and helps keep his pain tolerable.  He also feels that gabapentin reducing his nerve pain, however pain continues to be uncomfortable at times.  No side effects of medications.  Interval History 04/22/23 Barry Beltran is here for follow-up regarding his chronic pain in multiple areas.  He reports lately his knee has been hurting a lot.  Pain relief with cortisone did not last long enough, he is interested in trying viscosupplementation.  Pain in other areas such as his right ankle and lower back is unchanged from prior visit.  He continues to use oxycodone 5 mg for pain control.  This provides benefit and does not cause any side effects.   Interval History 06/20/2023 Barry Beltran is here for follow-up regarding his chronic pain.  He reports he continues to have pain but current medications are keeping it under control.   No side effects with medications.He reports he is much more active with current regimen than he was previously.  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  His right foot pain has been worse recently due to the cold weather.  His left knee has not been bothering him as much.  He also decided not to do knee injection with gel due to the cost of this injection.   Pain Inventory Average Pain 6 Pain Right Now 7 My pain is sharp, burning, stabbing, tingling, and aching  In the last 24 hours, has pain interfered with the following? General activity 7 Relation with others 8 Enjoyment of life 9 What TIME of day is your pain at its worst? daytime,  evening, and night Sleep (in general) Fair  Pain is worse with: walking, bending, inactivity, and standing Pain improves with: rest, heat/ice, therapy/exercise, and medication Relief from Meds: 8  Family History  Problem Relation Age of Onset   Hypertension Mother    Social History   Socioeconomic History   Marital status: Married    Spouse name: Not on file   Number of children: Not on file   Years of education: Not on file   Highest education level: Not on file  Occupational History   Not on file  Tobacco Use   Smoking status: Every Day    Current packs/day: 0.80    Types: Cigarettes   Smokeless tobacco: Never  Vaping Use   Vaping status: Not on file  Substance and Sexual Activity   Alcohol use: Yes    Comment: rarely   Drug use: No   Sexual activity: Not on file  Other Topics Concern   Not on file  Social History Narrative   Not on file   Social Determinants of Health   Financial Resource Strain: Patient Declined (08/09/2022)   Received from [facility], [facility]   Overall Financial Resource Strain (CARDIA)    Difficulty of Paying Living Expenses: Patient declined  Food Insecurity: Low Risk  (01/24/2023)   [facility]   Hunger Vital Sign    Worried About Running Out of Food in the Last Year: Never true    Ran Out of Food in the Last Year: Never true  Transportation Needs: Not on file (01/24/2023)  Physical Activity: Sufficiently Active (08/09/2022)   Received from [facility], [facility]   Exercise Vital Sign    Days of Exercise per Week: 7 days    Minutes of Exercise per Session: 60 min  Stress: No Stress Concern Present (08/09/2022)  Received from [facility], [facility]   [employer] of Occupational Health - Occupational Stress Questionnaire    Feeling of Stress : Not at all  Recent Concern: Stress - Stress Concern Present (05/18/2022)   Received from [facility] of Occupational  Health - Occupational Stress Questionnaire    Feeling of Stress : To some extent  Social Connections: Socially Integrated (08/09/2022)   Received from [facility], [facility]   Social Network    How would you rate your social network (family, work, friends)?: Good participation with social networks   No past surgical history on file. No past surgical history on file. Past Medical History:  Diagnosis Date   Hypertension    BP 116/73   Pulse 85   Ht 5\' 11"  (1.803 m)   Wt 174 lb 12.8 oz (79.3 kg)   SpO2 98%   BMI 24.38 kg/m   Opioid Risk Score:   Fall Risk Score:  `1  Depression screen PHQ 2/9     06/20/2023    1:02 PM 04/22/2023   11:14 AM 01/25/2023    9:33 AM 11/25/2022    9:22 AM 09/28/2022    9:29 AM 05/28/2022   10:00 AM 04/30/2022   10:05 AM  Depression screen PHQ 2/9  Decreased Interest 0 0 0 0 0 0 0  Down, Depressed, Hopeless 0 0 0 0 0 0 0  PHQ - 2 Score 0 0 0 0 0 0 0    Review of Systems  Constitutional: Negative.   HENT: Negative.    Eyes: Negative.   Respiratory: Negative.    Cardiovascular: Negative.   Gastrointestinal: Negative.   Endocrine: Negative.   Genitourinary: Negative.   Musculoskeletal:  Positive for back pain and gait problem.       Right foot pain, right hip pain, left knee pain  Skin: Negative.   Allergic/Immunologic: Negative.   Hematological: Negative.   Psychiatric/Behavioral: Negative.    All other systems reviewed and are negative.      Objective:   Physical Exam     06/20/2023    1:01 PM 04/22/2023   11:11 AM 01/25/2023    9:35 AM  Vitals with BMI  Height 5\' 11"  5\' 11"    Weight 174 lbs 13 oz    BMI 24.39    Systolic 116 134 259  Diastolic 73 89 107  Pulse 85 89      Gen: no distress, normal appearing HEENT: oral mucosa pink and moist, NCAT Chest: normal effort, normal rate of breathing Abd: soft, non-distended Ext: no edema Psych: pleasant, normal affect Skin: intact Neuro: Alert and oriented x4, follows  commands, cranial nerves II through XII grossly intact,  strength 5 out of 5 in b/l UE Strength 5 out of 5 in bilateral hip flexion, knee extension Strength 5 out of 5 left ankle PF and DF, strength 4 out of 5 right ankle PF and DF Sensation light touch intact in all 4 extremities Musculoskeletal:  Slump test negative bilaterally, facet loading positive L-spine paraspinal tenderness Pain over the right greater trochanter- not tested today Right ankle and midfoot tenderness with palpation Left knee joint line with mild tenderness- improved from prior visit     MRI L spine 09/17/21  FINDINGS: No significant interval change.   For the purposes of this dictation, it is assumed that the most caudal fully-segmented lumbar-type vertebra is L5, and that the most caudal fully-developed intervertebral disc is L5-S1.   Degenerative disc  disease (DDD) and facet arthropathy (see below).   Vertebral alignment: Slight retrolisthesis of L3 on L4, as before.   Marrow signal: No significant abnormality.  Vertebral body heights: Normal.  Conus medullaris: Terminates at a normal level. Normal in size and signal intensity.   Individual disc levels:   T12-L1: Normal.  L1-2: Normal.  L2-3: Normal.   L3-4: Degenerative changes, including slight retrolisthesis and a small left foraminal zone disc-osteophyte complex. Mild left neural foraminal stenosis. No significant spinal canal or right neural foraminal stenosis. Appearance similar to prior.   L4-5: Mild degenerative changes, including a small right foraminal zone disc-osteophyte complex. No significant central zone stenosis. Mild bilateral subarticular zone stenosis. Mild right neural foraminal stenosis. No significant left neural foraminal stenosis. Appearance similar to prior.   L5-S1: Mild degenerative changes. Small left subarticular zone disc protrusion, as before, with impingement on the descending left S1 nerve root. Overall moderate left  subarticular zone stenosis. No significant central zone stenosis. Mild right subarticular zone stenosis. No significant neural foraminal stenosis.   Xray R hip 08/05/21 Standing pelvis and right hip films taken and reviewed.  He has  well-fixed, well-positioned bilateral hip replacement implants, right side  has a dual mobility articulation.  No grossly complicating features    R foot 12/11/21             FINDINGS: Screws throughout all 5 digits traversing the interphalangeal joints. Some fusion of the first digit interphalangeal joint noted. Ankle joint arthroplasty. There is no evidence of fracture or dislocation. No cortical erosion or destruction. Soft tissues are unremarkable.   IMPRESSION: Screws throughout all 5 digits traversing the interphalangeal joints.     R ankle xray 05/08/2019 report "appropriate maintenance of alignment and stable overall TAR construct, with no  evidence of hardware complication or prosthetic loosening.  Forefoot  reconstruction surgery is healing nicely with excellent alignment to the "        Assessment & Plan:   Chronic ankle pain in s/p multiple ankle surgeries -Continue meloxicam current dose -Continue Tylenol as needed -UDS and pain contract completed prior visit -Continue oxycodone 5 mg up to 6 times a day as needed-ordered refills  -Consider transition to long acting medication -Continue gabapentin to 800mg  TID -Cymbalta- and amitriptyline made him feel bad and  in the past.  -Mod opiod risk tool -Monitor UDS, pill counts, PDMP -Narcan ordered for safety previously  -TENS unit did help -continue   Chronic lower back pain with lumbar spondylosis and left-sided radicular pain -Interventional procedures for his back being done at [facility] - he gets Facet joint L spine nerve ablations. -Continue treatment as above   Hip pain greater on the right s/p bilateral hip replacements -Continue meloxicam   Left knee pain, suspect OA -Appears  patient completed x-ray with Ortho, he did have a picture of this that he uploaded and it does appear to show OA to the medial compartment -Will schedule for Knee joint viscosupplementation,  limited by cost  -Zilretta would be an alternative medication to try   HTN, improved  -F/u with PCP

## 2023-07-03 ENCOUNTER — Other Ambulatory Visit: Payer: Self-pay | Admitting: Physical Medicine & Rehabilitation

## 2023-07-11 ENCOUNTER — Telehealth: Payer: Self-pay

## 2023-07-11 NOTE — Telephone Encounter (Signed)
Pharmacy sent a fax stating that Oxycodone on back order. Would you like patient to find at another pharmacy?

## 2023-07-12 NOTE — Telephone Encounter (Signed)
Patient states he received his medication for the month

## 2023-08-22 ENCOUNTER — Encounter: Payer: Self-pay | Admitting: Physical Medicine & Rehabilitation

## 2023-08-23 ENCOUNTER — Encounter: Payer: Medicare Other | Admitting: Physical Medicine & Rehabilitation

## 2023-08-31 ENCOUNTER — Other Ambulatory Visit: Payer: Self-pay | Admitting: Physical Medicine & Rehabilitation

## 2023-08-31 MED ORDER — OXYCODONE HCL 5 MG PO TABS: 5.0000 mg | ORAL_TABLET | ORAL | 0 refills | Status: DC | PRN

## 2023-08-31 NOTE — Telephone Encounter (Signed)
Oxycodone last filled per pmp    08/03/2023 06/20/2023 3  Oxycodone Hcl (Ir) 5 Mg Tablet 180.00 30 Yu Sht [id number] [id number] 0/0 45.00 [facility] [employer]

## 2023-09-02 ENCOUNTER — Other Ambulatory Visit: Payer: Self-pay | Admitting: Physical Medicine & Rehabilitation

## 2023-09-02 MED ORDER — OXYCODONE HCL 5 MG PO TABS: 5.0000 mg | ORAL_TABLET | ORAL | 0 refills | Status: DC | PRN

## 2023-09-02 NOTE — Telephone Encounter (Signed)
Please review and send if possible. Prior Rx in [place] has been cancelled due to availability. [facility] in [place] on [address] has it in stock for now. Please send new RX.   Thank you.  (Dr. Judithe Modest patient).

## 2023-09-02 NOTE — Telephone Encounter (Signed)
[facility] on [address] is out of the Rx now. Please send to [facility] on [address] in [place], [place]   Prior Rx was cancelled. Thank you.

## 2023-09-29 ENCOUNTER — Other Ambulatory Visit: Payer: Self-pay | Admitting: Physical Medicine & Rehabilitation

## 2023-09-30 ENCOUNTER — Encounter: Payer: Medicare Other | Admitting: Physical Medicine & Rehabilitation

## 2023-09-30 MED ORDER — OXYCODONE HCL 5 MG PO TABS: 5.0000 mg | ORAL_TABLET | ORAL | 0 refills | Status: DC | PRN

## 2023-10-14 ENCOUNTER — Encounter: Attending: Physical Medicine & Rehabilitation | Admitting: Physical Medicine & Rehabilitation

## 2023-10-14 ENCOUNTER — Encounter: Payer: Self-pay | Admitting: Physical Medicine & Rehabilitation

## 2023-10-14 VITALS — BP 167/107 | HR 86 | Ht 71.0 in | Wt 167.0 lb

## 2023-10-14 DIAGNOSIS — Z5181 Encounter for therapeutic drug level monitoring: Secondary | ICD-10-CM | POA: Diagnosis present

## 2023-10-14 DIAGNOSIS — M25571 Pain in right ankle and joints of right foot: Secondary | ICD-10-CM | POA: Diagnosis present

## 2023-10-14 DIAGNOSIS — M47816 Spondylosis without myelopathy or radiculopathy, lumbar region: Secondary | ICD-10-CM | POA: Diagnosis present

## 2023-10-14 DIAGNOSIS — G8929 Other chronic pain: Secondary | ICD-10-CM | POA: Insufficient documentation

## 2023-10-14 MED ORDER — OXYCODONE HCL 5 MG PO TABS: 5.0000 mg | ORAL_TABLET | ORAL | 0 refills | Status: DC | PRN

## 2023-10-14 NOTE — Progress Notes (Signed)
 Subjective:    Patient ID: Barry Beltran, male    DOB: 24-Aug-1975, 48 y.o.   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  Patient reports much of his pain is due to his prior surgeries.  Patient reports he had an ACL injury of his right knee which required surgery in 2015.  He then had a fusion of his right foot/ankle in 2016 that required repeat surgery in 2017.  He then had bone spurs removed from his right foot in 2017.  Reports he developed a required ankle replacement surgery in 2018 and then a Hallux valgus surgery March 2023.  Patient reports he developed left hip pain requiring replacement in 2021 and later right hip pain that was replaced shortly after.  He had a right hip dislocation and required an additional surgery for this.  Barry Beltran reports that his greatest pain in his right ankle and right hip.  He also has back pain due to herniated disks and reports a history of 18 epidural injections.  He reports occasional shooting pain down his left lower extremity.  Pain is constant sharp and burning.  He has had multiple rounds of physical therapy with some improvement.  To help relieve his pain he uses Tylenol 1 g 3 times a day.  He also takes meloxicam 15 mg, and gabapentin 300 mg 3 times daily.  Patient is also been getting oxycodone and occasionally tramadol.  He takes oxycodone about 5 mg 4-5 times a day and this helps to control his pain.  He reports this allows him to be more active at home and to walk much further.  Gabapentin dose is limited by sedation at higher doses.  He also wears a brace on his right foot often   Visit 05/31/22 [name] is here for follow-up of his chronic pain.  Pain continues to be worse in his right ankle.  Walking  continues to worsen the pain.  He continues to also have pain in his lower back will sometimes shoot down his left leg.  He also has pain in his hip on the right.  Patient reports that oxycodone 5 mg 6 times a day is keeping the pain under fairly good control.  And is allowing him to spend time with his family and be more active at home.  He says his pain did get worse a few weeks ago when he stopped using the THC Gummies.  He is not having any side effects the oxycodone.       Visit 05/29/2019 Barry Beltran is here for follow-up of his chronic pain.  He continues to have his worst pain in his right ankle.  This is worsened with ambulation.  He also is having pain in his lower back that shoots into his left thigh.  Oxycodone 5 mg 6 times a day as keeping the pain fairly well controlled.  He is not having any side effects with the medication.  He is not using THC Gummies anymore. Patient reports he is scheduled with [facility] to have repeat injections in his back.  He pulled he noted up on his phone where he had a  L4/5L 5/S1 medial brach block completed earlier this year.  He reports his procedure helped  the pain for over 6 months.  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    Interval history 07/27/2022 Barry Beltran is here for follow-up regarding his chronic pain.  He reports his foot and ankle pain has been worse recently.  His other areas of pain such as back and right hip are about the same.  He will occasionally have shooting pains on his left hip and thigh that worsens with activity.  He is not having this pain at this time.  He says he feels like his toenail on his right foot great toe is being squeezed or pulled off even though he no longer has the toenail here.  The oxycodone continues to help however does not last as long as it previously did.  He is not having any side effects with this medication.  He reports he is still following with [facility] and is  considering repeat of the medial branch blocks.  He also reports they were talking to him about a possible implantable device in his back, spinal cord stimulator?  He reports pain has been limiting his activities and he is not able to do as much as he previously was able to.  He continues to take gabapentin 600 mg twice daily.  It is not causing him any significant sedation or other side effects.  Reports TENS unit did not help previously.     Interval history 09/28/22 Barry Beltran is here for follow-up regarding treatment of his chronic pain.  Patient reports since the increase gabapentin the uncomfortable sensation around his skin has improved.  Oxycodone continues to help him with his pain, however not quite as well as it did at the beginning.  Overall pain is controlled and he is able to function better with the medication.  Medication helps him spend better quality time with his family.  He is also taking several classes regarding his employment, and plans to travel to [place] shortly to take a class.  Patient reports he had a nerve ablation for his lower back completed, reports some improvement with this treatment.     Interval History 11/25/22 Barry Beltran is here for follow-up regarding his back hip and ankle pain.  Oxycodone continues to keep his pain under control.  It is not as strong as it used to be however he does report that he is doing more than he used to.  He is able to spend time with his kids, exercise more, work on gun smithing and do more around the house.  Gabapentin continues to help his nerve pain at his incision sites.  He denies any side effects with medications.  He has been having some increased pain in his left knee for the past several weeks.  Interval History 01/25/23 Barry Beltran is here for follow-up regarding his chronic pain.  He reports that his hip has been a little better recently.  He continues to have back pain and right ankle pain.  He has also been having a lot of pain in his  left knee.  He had a x-ray completed by Ortho over his left knee.  Patient uploaded images of this x-ray and it does appear to show OA.  He had cortisone injection completed by Ortho 12/03/2022.  The cortisone injection left knee helped for about a month but knee pain has returned.  Knee pain is worse with activity.  Although oxycodone is not helping as much as it used to, he still feels that  it provides benefit and helps keep his pain tolerable.  He also feels that gabapentin reducing his nerve pain, however pain continues to be uncomfortable at times.  No side effects of medications.  Interval History 04/22/23 Barry Beltran is here for follow-up regarding his chronic pain in multiple areas.  He reports lately his knee has been hurting a lot.  Pain relief with cortisone did not last long enough, he is interested in trying viscosupplementation.  Pain in other areas such as his right ankle and lower back is unchanged from prior visit.  He continues to use oxycodone 5 mg for pain control.  This provides benefit and does not cause any side effects.   Interval History 06/20/2023 Barry Beltran is here for follow-up regarding his chronic pain.  He reports he continues to have pain but current medications are keeping it under control.   No side effects with medications.He reports he is much more active with current regimen than he was previously.  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  His right foot pain has been worse recently due to the cold weather.  His left knee has not been bothering him as much.  He also decided not to do knee injection with gel due to the cost of this injection.   Interval History 10/14/23 Pain overall unchanged from last few visits. Pain improving a little with warmer weather. Oxycodone 5mg  helping keep pain controlled. Reports he had recent flu and family had covid, now doing better. NO side effects with the medications.  No new concerns.  Patient reports  he realized gabapentin helping more then he previously realized.   Pain Inventory Average Pain 6 Pain Right Now 6  My pain is sharp, burning, stabbing, tingling, and aching  In the last 24 hours, has pain interfered with the following? General activity 5 Relation with others 10 Enjoyment of life 10 What TIME of day is your pain at its worst? night Sleep (in general) Fair  Pain is worse with: some activites Pain improves with: heat/ice, pacing activities, and medication Relief from Meds: 7  Family History  Problem Relation Age of Onset   Hypertension Mother    Social History   Socioeconomic History   Marital status: Married    Spouse name: Not on file   Number of children: Not on file   Years of education: Not on file   Highest education level: Not on file  Occupational History   Not on file  Tobacco Use   Smoking status: Every Day    Current packs/day: 0.80    Types: Cigarettes   Smokeless tobacco: Never  Vaping Use   Vaping status: Not on file  Substance and Sexual Activity   Alcohol use: Yes    Comment: rarely   Drug use: No   Sexual activity: Not on file  Other Topics Concern   Not on file  Social History Narrative   Not on file   Social Drivers of Health   Financial Resource Strain: Patient Declined (08/09/2022)   [facility]   Overall Financial Resource Strain (CARDIA)    Difficulty of Paying Living Expenses: Patient declined  Food Insecurity: Low Risk  (01/24/2023)   [facility]   Hunger Vital Sign    Worried About Running Out of Food in the Last Year: Never true    Ran Out of Food in the Last Year: Never true  Transportation Needs: Not on file (01/24/2023)  Physical Activity: Sufficiently Active (08/09/2022)   Received from [facility], [facility]   Exercise Vital Sign    Days of Exercise per Week: 7 days    Minutes of Exercise per Session: 60 min  Stress: No Stress Concern Present  (08/09/2022)   Received from [facility], [facility] - Occupational Stress Questionnaire    Feeling of Stress : Not at all  Recent Concern: Stress - Stress Concern Present (05/18/2022)   Received from [facility] - Occupational Stress Questionnaire    Feeling of Stress : To some extent  Social Connections: Socially Integrated (08/09/2022)   Received from [facility], [facility]   Social Network    How would you rate your social network (family, work, friends)?: Good participation with social networks   No past surgical history on file. No past surgical history on file. Past Medical History:  Diagnosis Date   Hypertension    There were no vitals taken for this visit.  Opioid Risk Score:   Fall Risk Score:  `1  Depression screen PHQ 2/9     06/20/2023    1:02 PM 04/22/2023   11:14 AM 01/25/2023    9:33 AM 11/25/2022    9:22 AM 09/28/2022    9:29 AM 05/28/2022   10:00 AM 04/30/2022   10:05 AM  Depression screen PHQ 2/9  Decreased Interest 0 0 0 0 0 0 0  Down, Depressed, Hopeless 0 0 0 0 0 0 0  PHQ - 2 Score 0 0 0 0 0 0 0    Review of Systems  Constitutional: Negative.   HENT: Negative.    Eyes: Negative.   Respiratory: Negative.    Cardiovascular: Negative.   Gastrointestinal: Negative.   Endocrine: Negative.   Genitourinary: Negative.   Musculoskeletal:  Positive for back pain and gait problem.       Right foot pain, right hip pain, left knee pain  Skin: Negative.   Allergic/Immunologic: Negative.   Hematological: Negative.   Psychiatric/Behavioral: Negative.    All other systems reviewed and are negative.      Objective:   Physical Exam     06/20/2023    1:01 PM 04/22/2023   11:11 AM 01/25/2023    9:35 AM  Vitals with BMI  Height 5\' 11"  5\' 11"    Weight 174 lbs 13 oz    BMI 24.39    Systolic 116 134 161  Diastolic 73 89 107  Pulse 85 89      Gen: no  distress, normal appearing HEENT: oral mucosa pink and moist, NCAT Chest: normal effort, normal rate of breathing Abd: soft, non-distended Ext: no edema Psych: pleasant, normal affect Skin: intact Neuro: Alert and oriented x4, follows commands, cranial nerves II through XII grossly intact,  strength 5 out of 5 in b/l UE Strength 5 out of 5 in bilateral hip flexion, knee extension Strength 5 out of 5 left ankle PF and DF, strength 4 out of 5 right ankle PF and DF Sensation light touch intact in all 4 extremities Musculoskeletal:  Slump test negative bilaterally Facet loading not checked today L-spine paraspinal tenderness b/l Pain over the right greater trochanter- not tested today Right ankle and midfoot TTP Mild L knee TTP      MRI L spine 09/17/21  FINDINGS: No significant interval change.   For the purposes of this dictation, it  is assumed that the most caudal fully-segmented lumbar-type vertebra is L5, and that the most caudal fully-developed intervertebral disc is L5-S1.   Degenerative disc disease (DDD) and facet arthropathy (see below).   Vertebral alignment: Slight retrolisthesis of L3 on L4, as before.   Marrow signal: No significant abnormality.  Vertebral body heights: Normal.  Conus medullaris: Terminates at a normal level. Normal in size and signal intensity.   Individual disc levels:   T12-L1: Normal.  L1-2: Normal.  L2-3: Normal.   L3-4: Degenerative changes, including slight retrolisthesis and a small left foraminal zone disc-osteophyte complex. Mild left neural foraminal stenosis. No significant spinal canal or right neural foraminal stenosis. Appearance similar to prior.   L4-5: Mild degenerative changes, including a small right foraminal zone disc-osteophyte complex. No significant central zone stenosis. Mild bilateral subarticular zone stenosis. Mild right neural foraminal stenosis. No significant left neural foraminal stenosis. Appearance similar to  prior.   L5-S1: Mild degenerative changes. Small left subarticular zone disc protrusion, as before, with impingement on the descending left S1 nerve root. Overall moderate left subarticular zone stenosis. No significant central zone stenosis. Mild right subarticular zone stenosis. No significant neural foraminal stenosis.   Xray R hip 08/05/21 Standing pelvis and right hip films taken and reviewed.  He has  well-fixed, well-positioned bilateral hip replacement implants, right side  has a dual mobility articulation.  No grossly complicating features    R foot 12/11/21             FINDINGS: Screws throughout all 5 digits traversing the interphalangeal joints. Some fusion of the first digit interphalangeal joint noted. Ankle joint arthroplasty. There is no evidence of fracture or dislocation. No cortical erosion or destruction. Soft tissues are unremarkable.   IMPRESSION: Screws throughout all 5 digits traversing the interphalangeal joints.     R ankle xray 05/08/2019 report "appropriate maintenance of alignment and stable overall TAR construct, with no  evidence of hardware complication or prosthetic loosening.  Forefoot  reconstruction surgery is healing nicely with excellent alignment to the "        Assessment & Plan:   Chronic ankle pain in s/p multiple ankle surgeries -Continue meloxicam current dose -Continue Tylenol as needed -UDS and pain contract completed prior visit -Continue oxycodone 5 mg up to 6 times a day as needed-ordered refills  -Consider transition to long acting medication -Continue gabapentin to 800mg  TID -Cymbalta- and amitriptyline made him feel bad and  in the past.  -Mod opiod risk tool -Monitor UDS, pill counts, PDMP -Narcan ordered for safety previously  -TENS unit did help -continue -Drug screen today, pt reports he has tried CBD product- will monitor results   Chronic lower back pain with lumbar spondylosis and left-sided radicular  pain -Interventional procedures for his back being done at [facility] - he gets Facet joint L spine nerve ablations. Reports has not had recent repeat procedure, still doing ok.  -Continue treatment as above   Hip pain greater on the right s/p bilateral hip replacements -Continue meloxicam   Left knee pain, suspect OA -Appears patient completed x-ray with Ortho, he did have a picture of this that he uploaded and it does appear to show OA to the medial compartment -Will schedule for Knee joint viscosupplementation,  limited by cost  -Zilretta would be an alternative medication to try   HTN, improved  -F/u with PCP

## 2023-10-18 LAB — DRUG TOX MONITOR 1 W/CONF, ORAL FLD
Amphetamines: NEGATIVE ng/mL (ref <10)
Barbiturates: NEGATIVE ng/mL (ref <10)
Benzodiazepines: NEGATIVE ng/mL (ref <0.50)
Buprenorphine: NEGATIVE ng/mL (ref <0.10)
Cocaine: NEGATIVE ng/mL (ref <5.0)
Codeine: NEGATIVE ng/mL (ref <2.5)
Cotinine: >250 ng/mL — ABNORMAL HIGH (ref <5.0)
Dihydrocodeine: NEGATIVE ng/mL (ref <2.5)
Fentanyl: NEGATIVE ng/mL (ref <0.10)
Heroin Metabolite: NEGATIVE ng/mL (ref <1.0)
Hydrocodone: NEGATIVE ng/mL (ref <2.5)
Hydromorphone: NEGATIVE ng/mL (ref <2.5)
MARIJUANA: NEGATIVE ng/mL (ref <2.5)
MDMA: NEGATIVE ng/mL (ref <10)
Meprobamate: NEGATIVE ng/mL (ref <2.5)
Methadone: NEGATIVE ng/mL (ref <5.0)
Morphine: NEGATIVE ng/mL (ref <2.5)
Nicotine Metabolite: POSITIVE ng/mL — AB (ref <5.0)
Norhydrocodone: NEGATIVE ng/mL (ref <2.5)
Noroxycodone: 25.4 ng/mL — ABNORMAL HIGH (ref <2.5)
Opiates: POSITIVE ng/mL — AB (ref <2.5)
Oxycodone: >250 ng/mL — ABNORMAL HIGH (ref <2.5)
Oxymorphone: NEGATIVE ng/mL (ref <2.5)
Phencyclidine: NEGATIVE ng/mL (ref <10)
Tapentadol: NEGATIVE ng/mL (ref <5.0)
Tramadol: NEGATIVE ng/mL (ref <5.0)
Zolpidem: NEGATIVE ng/mL (ref <5.0)

## 2023-10-18 LAB — DRUG TOX ALC METAB W/CON, ORAL FLD: Alcohol Metabolite: NEGATIVE ng/mL (ref <25)

## 2023-11-09 ENCOUNTER — Other Ambulatory Visit: Payer: Self-pay | Admitting: Physical Medicine & Rehabilitation

## 2023-11-09 MED ORDER — OXYCODONE HCL 5 MG PO TABS: 5.0000 mg | ORAL_TABLET | ORAL | 0 refills | Status: DC | PRN

## 2023-11-09 NOTE — Telephone Encounter (Signed)
 PMP was Reviewed.  Dr. Rayleen Cal note was reviewed.  Oxycodone  e-scribed to pharmacy.

## 2023-11-23 ENCOUNTER — Other Ambulatory Visit: Payer: Self-pay | Admitting: Physical Medicine & Rehabilitation

## 2023-12-07 ENCOUNTER — Encounter: Payer: Self-pay | Admitting: Physical Medicine & Rehabilitation

## 2023-12-07 MED ORDER — OXYCODONE HCL 5 MG PO TABS: 5.0000 mg | ORAL_TABLET | ORAL | 0 refills | Status: DC | PRN

## 2023-12-09 ENCOUNTER — Encounter: Attending: Physical Medicine & Rehabilitation | Admitting: Physical Medicine & Rehabilitation

## 2023-12-09 ENCOUNTER — Encounter: Payer: Self-pay | Admitting: Physical Medicine & Rehabilitation

## 2023-12-09 VITALS — BP 146/91 | HR 82 | Ht 71.0 in | Wt 166.0 lb

## 2023-12-09 DIAGNOSIS — M47816 Spondylosis without myelopathy or radiculopathy, lumbar region: Secondary | ICD-10-CM | POA: Diagnosis present

## 2023-12-09 DIAGNOSIS — M1712 Unilateral primary osteoarthritis, left knee: Secondary | ICD-10-CM | POA: Diagnosis present

## 2023-12-09 DIAGNOSIS — Z79891 Long term (current) use of opiate analgesic: Secondary | ICD-10-CM | POA: Diagnosis present

## 2023-12-09 DIAGNOSIS — G8929 Other chronic pain: Secondary | ICD-10-CM | POA: Insufficient documentation

## 2023-12-09 DIAGNOSIS — Z5181 Encounter for therapeutic drug level monitoring: Secondary | ICD-10-CM | POA: Diagnosis present

## 2023-12-09 DIAGNOSIS — M25571 Pain in right ankle and joints of right foot: Secondary | ICD-10-CM | POA: Insufficient documentation

## 2023-12-09 MED ORDER — OXYCODONE HCL 5 MG PO TABS: 5.0000 mg | ORAL_TABLET | ORAL | 0 refills | Status: DC | PRN

## 2023-12-09 NOTE — Progress Notes (Signed)
 Subjective:    Patient ID: Barry Beltran, male    DOB: [birth year], 48 y.o.   MRN: [id number]  HPI  HPI   Mr. 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  Patient reports much of his pain is due to his prior surgeries.  Patient reports he had an ACL injury of his right knee which required surgery in 2015.  He then had a fusion of his right foot/ankle in 2016 that required repeat surgery in 2017.  He then had bone spurs removed from his right foot in 2017.  Reports he developed a required ankle replacement surgery in 2018 and then a Hallux valgus surgery March 2023.  Patient reports he developed left hip pain requiring replacement in 2021 and later right hip pain that was replaced shortly after.  He had a right hip dislocation and required an additional surgery for this.  Mr. Barry Beltran reports that his greatest pain in his right ankle and right hip.  He also has back pain due to herniated disks and reports a history of 18 epidural injections.  He reports occasional shooting pain down his left lower extremity.  Pain is constant sharp and burning.  He has had multiple rounds of physical therapy with some improvement.  To help relieve his pain he uses Tylenol  1 g 3 times a day.  He also takes meloxicam  15 mg, and gabapentin  300 mg 3 times daily.  Patient is also been getting oxycodone  and occasionally tramadol.  He takes oxycodone  about 5 mg 4-5 times a day and this helps to control his pain.  He reports this allows him to be more active at home and to walk much further.  Gabapentin  dose is limited by sedation at higher doses.  He also wears a brace on his right foot often   Visit 05/31/22 Mr. Barry Beltran is here for follow-up of his chronic pain.  Pain continues to be worse in his right ankle.  Walking  continues to worsen the pain.  He continues to also have pain in his lower back will sometimes shoot down his left leg.  He also has pain in his hip on the right.  Patient reports that oxycodone  5 mg 6 times a day is keeping the pain under fairly good control.  And is allowing him to spend time with his family and be more active at home.  He says his pain did get worse a few weeks ago when he stopped using the THC Gummies.  He is not having any side effects the oxycodone .       Visit 05/29/2019 Mr. Barry Beltran is here for follow-up of his chronic pain.  He continues to have his worst pain in his right ankle.  This is worsened with ambulation.  He also is having pain in his lower back that shoots into his left thigh.  Oxycodone  5 mg 6 times a day as keeping the pain fairly well controlled.  He is not having any side effects with the medication.  He is not using THC Gummies anymore. Patient reports he is scheduled with [facility] to have repeat injections in his back.  He pulled he noted up on his phone where he had a  L4/5L 5/S1 medial brach block completed earlier this year.  He reports his procedure helped  the pain for over 6 months.  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    Interval history 07/27/2022 Mr. Barry Beltran is here for follow-up regarding his chronic pain.  He reports his foot and ankle pain has been worse recently.  His other areas of pain such as back and right hip are about the same.  He will occasionally have shooting pains on his left hip and thigh that worsens with activity.  He is not having this pain at this time.  He says he feels like his toenail on his right foot great toe is being squeezed or pulled off even though he no longer has the toenail here.  The oxycodone  continues to help however does not last as long as it previously did.  He is not having any side effects with this medication.  He reports he is still following with [facility] and is  considering repeat of the medial branch blocks.  He also reports they were talking to him about a possible implantable device in his back, spinal cord stimulator?  He reports pain has been limiting his activities and he is not able to do as much as he previously was able to.  He continues to take gabapentin  600 mg twice daily.  It is not causing him any significant sedation or other side effects.  Reports TENS unit did not help previously.     Interval history 09/28/22 Mr. Barry Beltran is here for follow-up regarding treatment of his chronic pain.  Patient reports since the increase gabapentin  the uncomfortable sensation around his skin has improved.  Oxycodone  continues to help him with his pain, however not quite as well as it did at the beginning.  Overall pain is controlled and he is able to function better with the medication.  Medication helps him spend better quality time with his family.  He is also taking several classes regarding his employment, and plans to travel to [place] shortly to take a class.  Patient reports he had a nerve ablation for his lower back completed, reports some improvement with this treatment.     Interval History 11/25/22 Mr. Barry Beltran is here for follow-up regarding his back hip and ankle pain.  Oxycodone  continues to keep his pain under control.  It is not as strong as it used to be however he does report that he is doing more than he used to.  He is able to spend time with his kids, exercise more, work on gun smithing and do more around the house.  Gabapentin  continues to help his nerve pain at his incision sites.  He denies any side effects with medications.  He has been having some increased pain in his left knee for the past several weeks.  Interval History 01/25/23 Mr. Barry Beltran is here for follow-up regarding his chronic pain.  He reports that his hip has been a little better recently.  He continues to have back pain and right ankle pain.  He has also been having a lot of pain in his  left knee.  He had a x-ray completed by Ortho over his left knee.  Patient uploaded images of this x-ray and it does appear to show OA.  He had cortisone injection completed by Ortho 12/03/2022.  The cortisone injection left knee helped for about a month but knee pain has returned.  Knee pain is worse with activity.  Although oxycodone  is not helping as much as it used to, he still feels that  it provides benefit and helps keep his pain tolerable.  He also feels that gabapentin  reducing his nerve pain, however pain continues to be uncomfortable at times.  No side effects of medications.  Interval History 04/22/23 Mr. Barry Beltran is here for follow-up regarding his chronic pain in multiple areas.  He reports lately his knee has been hurting a lot.  Pain relief with cortisone did not last long enough, he is interested in trying viscosupplementation.  Pain in other areas such as his right ankle and lower back is unchanged from prior visit.  He continues to use oxycodone  5 mg for pain control.  This provides benefit and does not cause any side effects.   Interval History 06/20/2023 Mr. Barry Beltran is here for follow-up regarding his chronic pain.  He reports he continues to have pain but current medications are keeping it under control.   No side effects with medications.He reports he is much more active with current regimen than he was previously.  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  His right foot pain has been worse recently due to the cold weather.  His left knee has not been bothering him as much.  He also decided not to do knee injection with gel due to the cost of this injection.   Interval History 10/14/23 Pain overall unchanged from last few visits. Pain improving a little with warmer weather. Oxycodone  5mg  helping keep pain controlled. Reports he had recent flu and family had covid, now doing better. NO side effects with the medications.  No new concerns.  Patient reports  he realized gabapentin  helping more then he previously realized.   Interval History 12/09/23 Pain overall controlled with current medication. Gabapentin  and oxycodone  keeping his pain tolerable. He remains active, working on Astr[employer].  No side effects with the medication.  R ankle and foot tingling burning pain is his greatest area of pain. He has back pain more on the right side, left L spine pain improved with injections at [facility]. L knee pain is doing ok.  It sometimes hurts with activity at medial joint line.    Pain Inventory Average Pain 6 Pain Right Now 7  My pain is sharp, burning, stabbing, tingling, and aching  In the last 24 hours, has pain interfered with the following? General activity 6 Relation with others 10 Enjoyment of life 10 What TIME of day is your pain at its worst? evening and night Sleep (in general) Fair  Pain is worse with: some activites Pain improves with: heat/ice, pacing activities, and medication Relief from Meds: 8  Family History  Problem Relation Age of Onset   Hypertension Mother    Social History   Socioeconomic History   Marital status: Married    Spouse name: Not on file   Number of children: Not on file   Years of education: Not on file   Highest education level: Not on file  Occupational History   Not on file  Tobacco Use   Smoking status: Every Day    Current packs/day: 0.80    Types: Cigarettes   Smokeless tobacco: Never  Vaping Use   Vaping status: Never Used  Substance and Sexual Activity   Alcohol use: Yes    Comment: rarely   Drug use: No   Sexual activity: Not on file  Other Topics Concern   Not on file  Social History Narrative   Not on file   Social Drivers  of Health   Financial Resource Strain: Patient Declined (08/09/2022)   Received from [facility], [facility]   Overall Financial Resource Strain (CARDIA)    Difficulty of Paying Living Expenses: Patient declined  Food Insecurity: Low Risk   (01/24/2023)   [facility]   Hunger Vital Sign    Worried About Running Out of Food in the Last Year: Never true    Ran Out of Food in the Last Year: Never true  Transportation Needs: Not on file (01/24/2023)  Physical Activity: Sufficiently Active (08/09/2022)   Received from [facility], [facility]   Exercise Vital Sign    Days of Exercise per Week: 7 days    Minutes of Exercise per Session: 60 min  Stress: No Stress Concern Present (08/09/2022)   Received from [facility], [facility] - Occupational Stress Questionnaire    Feeling of Stress : Not at all  Recent Concern: Stress - Stress Concern Present (05/18/2022)   Received from [facility] - Occupational Stress Questionnaire    Feeling of Stress : To some extent  Social Connections: Socially Integrated (08/09/2022)   Received from [facility], [facility]   Social Network    How would you rate your social network (family, work, friends)?: Good participation with social networks   History reviewed. No pertinent surgical history. History reviewed. No pertinent surgical history. Past Medical History:  Diagnosis Date   Hypertension    BP (!) 146/91   Pulse 82   Ht 5\' 11"  (1.803 m)   Wt 166 lb (75.3 kg)   SpO2 98%   BMI 23.15 kg/m   Opioid Risk Score:   Fall Risk Score:  `1  Depression screen [facility] 2/9     12/09/2023    9:24 AM 10/14/2023    9:43 AM 06/20/2023    1:02 PM 04/22/2023   11:14 AM 01/25/2023    9:33 AM 11/25/2022    9:22 AM 09/28/2022    9:29 AM  Depression screen PHQ 2/9  Decreased Interest 0 0 0 0 0 0 0  Down, Depressed, Hopeless 0 0 0 0 0 0 0  PHQ - 2 Score 0 0 0 0 0 0 0    Review of Systems  Constitutional: Negative.   HENT: Negative.    Eyes: Negative.   Respiratory: Negative.    Cardiovascular: Negative.   Gastrointestinal: Negative.   Endocrine: Negative.    Genitourinary: Negative.   Musculoskeletal:  Positive for back pain and gait problem.       Right foot pain, right hip pain, right knee pain  Skin: Negative.   Allergic/Immunologic: Negative.   Hematological: Negative.   Psychiatric/Behavioral: Negative.    All other systems reviewed and are negative.      Objective:   Physical Exam     12/09/2023    9:31 AM 12/09/2023    9:17 AM 10/14/2023    9:47 AM  Vitals with BMI  Height  5\' 11"    Weight  166 lbs   BMI  23.16   Systolic 146 164 284  Diastolic 91 92 107  Pulse  82      Gen: no distress, normal appearing HEENT: oral mucosa pink and moist, NCAT Chest: normal effort, normal rate of breathing Abd: soft, non-distended Ext: no edema Psych: pleasant, normal affect Skin: intact Neuro: Alert and oriented x4, follows commands, cranial nerves II through XII  grossly intact,  strength 5 out of 5 in b/l UE Strength 5 out of 5 in bilateral hip flexion, knee extension Strength 5 out of 5 left ankle PF and DF, strength 4 out of 5 right ankle PF and DF Sensation light touch intact in all 4 extremities Musculoskeletal:  Slump test negative bilaterally Facet loading - not checked today Pain over the right greater trochanter- not tested today L-spine paraspinal tenderness b/l R>L Right ankle and midfoot TTP Mild L knee TTP medial joint line     MRI L spine 09/17/21  FINDINGS: No significant interval change.   For the purposes of this dictation, it is assumed that the most caudal fully-segmented lumbar-type vertebra is L5, and that the most caudal fully-developed intervertebral disc is L5-S1.   Degenerative disc disease (DDD) and facet arthropathy (see below).   Vertebral alignment: Slight retrolisthesis of L3 on L4, as before.   Marrow signal: No significant abnormality.  Vertebral body heights: Normal.  Conus medullaris: Terminates at a normal level. Normal in size and signal intensity.   Individual disc levels:    T12-L1: Normal.  L1-2: Normal.  L2-3: Normal.   L3-4: Degenerative changes, including slight retrolisthesis and a small left foraminal zone disc-osteophyte complex. Mild left neural foraminal stenosis. No significant spinal canal or right neural foraminal stenosis. Appearance similar to prior.   L4-5: Mild degenerative changes, including a small right foraminal zone disc-osteophyte complex. No significant central zone stenosis. Mild bilateral subarticular zone stenosis. Mild right neural foraminal stenosis. No significant left neural foraminal stenosis. Appearance similar to prior.   L5-S1: Mild degenerative changes. Small left subarticular zone disc protrusion, as before, with impingement on the descending left S1 nerve root. Overall moderate left subarticular zone stenosis. No significant central zone stenosis. Mild right subarticular zone stenosis. No significant neural foraminal stenosis.   Xray R hip 08/05/21 Standing pelvis and right hip films taken and reviewed.  He has  well-fixed, well-positioned bilateral hip replacement implants, right side  has a dual mobility articulation.  No grossly complicating features    R foot 12/11/21             FINDINGS: Screws throughout all 5 digits traversing the interphalangeal joints. Some fusion of the first digit interphalangeal joint noted. Ankle joint arthroplasty. There is no evidence of fracture or dislocation. No cortical erosion or destruction. Soft tissues are unremarkable.   IMPRESSION: Screws throughout all 5 digits traversing the interphalangeal joints.     R ankle xray 05/08/2019 report "appropriate maintenance of alignment and stable overall TAR construct, with no  evidence of hardware complication or prosthetic loosening.  Forefoot  reconstruction surgery is healing nicely with excellent alignment to the "        Assessment & Plan:   Chronic ankle pain in s/p multiple ankle surgeries -Continue meloxicam  current  dose -Continue Tylenol  as needed -UDS and pain contract completed prior visit -Continue oxycodone  5 mg up to 6 times a day as needed-ordered refills  -Consider transition to long acting medication -Continue gabapentin  to 800mg  TID -Cymbalta- and amitriptyline made him feel bad and  in the past.  -Mod opiod risk tool -Monitor UDS, pill counts, PDMP -Narcan  ordered for safety previously  -TENS unit did help -continue -Drug screen prior visit, pt reported he has tried CBD product- results ok   Chronic lower back pain with lumbar spondylosis and left-sided radicular pain -Interventional procedures for his back being done at [facility] - he gets Facet joint L spine nerve  ablations. Reports has not had recent repeat procedure, still doing ok.  More pain on right side recently he is not interested in procedure for this side currently. -Continue treatment as above   Hip pain greater on the right s/p bilateral hip replacements -Continue meloxicam    Left knee pain, suspect OA -Appears patient completed x-ray with Ortho, he did have a picture of this that he uploaded and it does appear to show OA to the medial compartment -Knee joint viscosupplementation,  limited by cost  -Zilretta would be an alternative medication to try   HTN, improved  -F/u with PCP

## 2023-12-19 ENCOUNTER — Encounter: Payer: Self-pay | Admitting: Physical Medicine & Rehabilitation

## 2023-12-19 ENCOUNTER — Other Ambulatory Visit: Payer: Self-pay | Admitting: Physical Medicine & Rehabilitation

## 2023-12-19 MED ORDER — GABAPENTIN 800 MG PO TABS
800.0000 mg | ORAL_TABLET | Freq: Three times a day (TID) | ORAL | 3 refills | Status: DC
Start: 2023-12-19 — End: 2023-12-19

## 2024-01-30 ENCOUNTER — Encounter: Payer: Self-pay | Admitting: Physical Medicine & Rehabilitation

## 2024-01-31 MED ORDER — OXYCODONE HCL 5 MG PO TABS: 5.0000 mg | ORAL_TABLET | ORAL | 0 refills | Status: DC | PRN

## 2024-02-01 MED ORDER — OXYCODONE HCL 5 MG PO TABS: 5.0000 mg | ORAL_TABLET | ORAL | 0 refills | Status: DC | PRN

## 2024-02-06 ENCOUNTER — Encounter: Attending: Physical Medicine & Rehabilitation | Admitting: Physical Medicine & Rehabilitation

## 2024-02-06 ENCOUNTER — Encounter: Payer: Self-pay | Admitting: Physical Medicine & Rehabilitation

## 2024-02-06 VITALS — BP 160/92 | HR 73 | Ht 71.0 in | Wt 156.4 lb

## 2024-02-06 DIAGNOSIS — M47816 Spondylosis without myelopathy or radiculopathy, lumbar region: Secondary | ICD-10-CM | POA: Insufficient documentation

## 2024-02-06 DIAGNOSIS — M792 Neuralgia and neuritis, unspecified: Secondary | ICD-10-CM | POA: Insufficient documentation

## 2024-02-06 DIAGNOSIS — M25571 Pain in right ankle and joints of right foot: Secondary | ICD-10-CM | POA: Insufficient documentation

## 2024-02-06 DIAGNOSIS — Z79891 Long term (current) use of opiate analgesic: Secondary | ICD-10-CM | POA: Diagnosis present

## 2024-02-06 DIAGNOSIS — G8929 Other chronic pain: Secondary | ICD-10-CM | POA: Diagnosis present

## 2024-02-06 MED ORDER — OXYCODONE HCL 5 MG PO TABS: 5.0000 mg | ORAL_TABLET | ORAL | 0 refills | Status: DC | PRN

## 2024-02-06 MED ORDER — PREGABALIN 200 MG PO CAPS: 200.0000 mg | ORAL_CAPSULE | Freq: Two times a day (BID) | ORAL | 2 refills | Status: DC

## 2024-02-06 NOTE — Progress Notes (Signed)
 Subjective:    Patient ID: Barry Beltran, male    DOB: 01-Feb-1976, 48 y.o.   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  Patient reports much of his pain is due to his prior surgeries.  Patient reports he had an ACL injury of his right knee which required surgery in 2015.  He then had a fusion of his right foot/ankle in 2016 that required repeat surgery in 2017.  He then had bone spurs removed from his right foot in 2017.  Reports he developed a required ankle replacement surgery in 2018 and then a Hallux valgus surgery March 2023.  Patient reports he developed left hip pain requiring replacement in 2021 and later right hip pain that was replaced shortly after.  He had a right hip dislocation and required an additional surgery for this.  Barry Beltran reports that his greatest pain in his right ankle and right hip.  He also has back pain due to herniated disks and reports a history of 18 epidural injections.  He reports occasional shooting pain down his left lower extremity.  Pain is constant sharp and burning.  He has had multiple rounds of physical therapy with some improvement.  To help relieve his pain he uses Tylenol  1 g 3 times a day.  He also takes meloxicam  15 mg, and gabapentin  300 mg 3 times daily.  Patient is also been getting oxycodone  and occasionally tramadol.  He takes oxycodone  about 5 mg 4-5 times a day and this helps to control his pain.  He reports this allows him to be more active at home and to walk much further.  Gabapentin  dose is limited by sedation at higher doses.  He also wears a brace on his right foot often   Visit 05/31/22 Barry Beltran is here for follow-up of his chronic pain.  Pain continues to be worse in his right ankle.  Walking  continues to worsen the pain.  He continues to also have pain in his lower back will sometimes shoot down his left leg.  He also has pain in his hip on the right.  Patient reports that oxycodone  5 mg 6 times a day is keeping the pain under fairly good control.  And is allowing him to spend time with his family and be more active at home.  He says his pain did get worse a few weeks ago when he stopped using the THC Gummies.  He is not having any side effects the oxycodone .       Visit 05/29/2019 Barry Beltran is here for follow-up of his chronic pain.  He continues to have his worst pain in his right ankle.  This is worsened with ambulation.  He also is having pain in his lower back that shoots into his left thigh.  Oxycodone  5 mg 6 times a day as keeping the pain fairly well controlled.  He is not having any side effects with the medication.  He is not using THC Gummies anymore. Patient reports he is scheduled with [facility] to have repeat injections in his back.  He pulled he noted up on his phone where he had a  L4/5L 5/S1 medial brach block completed earlier this year.  He reports his procedure helped  the pain for over 6 months.  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    Interval history 07/27/2022 Barry Beltran is here for follow-up regarding his chronic pain.  He reports his foot and ankle pain has been worse recently.  His other areas of pain such as back and right hip are about the same.  He will occasionally have shooting pains on his left hip and thigh that worsens with activity.  He is not having this pain at this time.  He says he feels like his toenail on his right foot great toe is being squeezed or pulled off even though he no longer has the toenail here.  The oxycodone  continues to help however does not last as long as it previously did.  He is not having any side effects with this medication.  He reports he is still following with [facility] and is  considering repeat of the medial branch blocks.  He also reports they were talking to him about a possible implantable device in his back, spinal cord stimulator?  He reports pain has been limiting his activities and he is not able to do as much as he previously was able to.  He continues to take gabapentin  600 mg twice daily.  It is not causing him any significant sedation or other side effects.  Reports TENS unit did not help previously.     Interval history 09/28/22 Barry Beltran is here for follow-up regarding treatment of his chronic pain.  Patient reports since the increase gabapentin  the uncomfortable sensation around his skin has improved.  Oxycodone  continues to help him with his pain, however not quite as well as it did at the beginning.  Overall pain is controlled and he is able to function better with the medication.  Medication helps him spend better quality time with his family.  He is also taking several classes regarding his employment, and plans to travel to [place] shortly to take a class.  Patient reports he had a nerve ablation for his lower back completed, reports some improvement with this treatment.     Interval History 11/25/22 Barry Beltran is here for follow-up regarding his back hip and ankle pain.  Oxycodone  continues to keep his pain under control.  It is not as strong as it used to be however he does report that he is doing more than he used to.  He is able to spend time with his kids, exercise more, work on gun smithing and do more around the house.  Gabapentin  continues to help his nerve pain at his incision sites.  He denies any side effects with medications.  He has been having some increased pain in his left knee for the past several weeks.  Interval History 01/25/23 Barry Beltran is here for follow-up regarding his chronic pain.  He reports that his hip has been a little better recently.  He continues to have back pain and right ankle pain.  He has also been having a lot of pain in his  left knee.  He had a x-ray completed by Ortho over his left knee.  Patient uploaded images of this x-ray and it does appear to show OA.  He had cortisone injection completed by Ortho 12/03/2022.  The cortisone injection left knee helped for about a month but knee pain has returned.  Knee pain is worse with activity.  Although oxycodone  is not helping as much as it used to, he still feels that  it provides benefit and helps keep his pain tolerable.  He also feels that gabapentin  reducing his nerve pain, however pain continues to be uncomfortable at times.  No side effects of medications.  Interval History 04/22/23 Barry Beltran is here for follow-up regarding his chronic pain in multiple areas.  He reports lately his knee has been hurting a lot.  Pain relief with cortisone did not last long enough, he is interested in trying viscosupplementation.  Pain in other areas such as his right ankle and lower back is unchanged from prior visit.  He continues to use oxycodone  5 mg for pain control.  This provides benefit and does not cause any side effects.   Interval History 06/20/2023 Barry Beltran is here for follow-up regarding his chronic pain.  He reports he continues to have pain but current medications are keeping it under control.   No side effects with medications.He reports he is much more active with current regimen than he was previously.  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  His right foot pain has been worse recently due to the cold weather.  His left knee has not been bothering him as much.  He also decided not to do knee injection with gel due to the cost of this injection.   Interval History 10/14/23 Pain overall unchanged from last few visits. Pain improving a little with warmer weather. Oxycodone  5mg  helping keep pain controlled. Reports he had recent flu and family had covid, now doing better. NO side effects with the medications.  No new concerns.  Patient reports  he realized gabapentin  helping more then he previously realized.   Interval History 12/09/23 Pain overall controlled with current medication. Gabapentin  and oxycodone  keeping his pain tolerable. He remains active, working on [employer].  No side effects with the medication.  R ankle and foot tingling burning pain is his greatest area of pain. He has back pain more on the right side, left L spine pain improved with injections at [facility]. L knee pain is doing ok.  It sometimes hurts with activity at medial joint line.     Interval History 02/06/2024 Patient has been having increased pain in his right foot.  Patient reports this feels like nerve pain.  It is of stinging, numb, burning, tingling and shocking quality.  Pain is over his dorsal right foot and over his toes.  He reports its definitely nerve pain .  He reports gabapentin  was helping initially when he started it but he feels like it is not controlling the pain as well currently.  Oxycodone  as needed continues to help his overall pain and he is not having any side effects with this medication.  Pain Inventory Average Pain 6 Pain Right Now 7  My pain is sharp, burning, stabbing, tingling, and aching  In the last 24 hours, has pain interfered with the following? General activity 2,3  Relation with others 0 Enjoyment of life 0 What TIME of day is your pain at its worst? evening and night Sleep (in general) Good  Pain is worse with: some activites, walking Pain improves with: heat/ice, pacing activities, and medication Relief from Meds: 8  Family History  Problem Relation Age of Onset   Hypertension Mother    Social History   Socioeconomic History   Marital status: Married    Spouse name: Not on file   Number of children: Not on file   Years of education: Not  on file   Highest education level: Not on file  Occupational History   Not on file  Tobacco Use   Smoking status: Every Day    Current packs/day: 0.80     Types: Cigarettes   Smokeless tobacco: Never  Vaping Use   Vaping status: Never Used  Substance and Sexual Activity   Alcohol use: Yes    Comment: rarely   Drug use: No   Sexual activity: Not on file  Other Topics Concern   Not on file  Social History Narrative   Not on file   Social Drivers of Health   Financial Resource Strain: Patient Declined (08/09/2022)   Received from [facility]   Overall Financial Resource Strain (CARDIA)    Difficulty of Paying Living Expenses: Patient declined  Food Insecurity: Low Risk  (01/24/2023)   Received from [facility]   Hunger Vital Sign    Within the past 12 months, you worried that your food would run out before you got money to buy more: Never true    Within the past 12 months, the food you bought just didn't last and you didn't have money to get more. : Never true  Transportation Needs: Not on file (01/24/2023)  Physical Activity: Sufficiently Active (08/09/2022)   Received from [facility]   Exercise Vital Sign    On average, how many days per week do you engage in moderate to strenuous exercise (like a brisk walk)?: 7 days    On average, how many minutes do you engage in exercise at this level?: 60 min  Stress: No Stress Concern Present (08/09/2022)   Received from [facility] of Occupational Health - Occupational Stress Questionnaire    Feeling of Stress : Not at all  Recent Concern: Stress - Stress Concern Present (05/18/2022)   Received from [facility] of Occupational Health - Occupational Stress Questionnaire    Feeling of Stress : To some extent  Social Connections: Socially Integrated (08/09/2022)   Received from [facility]   Social Network    How would you rate your social network (family, work, friends)?: Good participation with social networks   No past surgical history on file. No past surgical history on file. Past Medical History:  Diagnosis Date   Hypertension     BP (!) 160/92 (BP Location: Left Arm, Patient Position: Sitting, Cuff Size: Normal)   Pulse 73   Ht 5' 11 (1.803 m)   Wt 156 lb 6.4 oz (70.9 kg)   SpO2 98%   BMI 21.81 kg/m   Opioid Risk Score:   Fall Risk Score:  `1  Depression screen [facility] 2/9     02/06/2024    9:39 AM 12/09/2023    9:24 AM 10/14/2023    9:43 AM 06/20/2023    1:02 PM 04/22/2023   11:14 AM 01/25/2023    9:33 AM 11/25/2022    9:22 AM  Depression screen PHQ 2/9  Decreased Interest 0 0 0 0 0 0 0  Down, Depressed, Hopeless 0 0 0 0 0 0 0  PHQ - 2 Score 0 0 0 0 0 0 0    Review of Systems  Constitutional: Negative.   HENT: Negative.    Eyes: Negative.   Respiratory: Negative.    Cardiovascular: Negative.   Gastrointestinal: Negative.   Endocrine: Negative.   Genitourinary: Negative.   Musculoskeletal:  Positive for back pain and gait problem.       Right  foot pain, right hip pain, right knee pain  Skin: Negative.   Allergic/Immunologic: Negative.   Hematological: Negative.   Psychiatric/Behavioral: Negative.    All other systems reviewed and are negative.      Objective:   Physical Exam     02/06/2024    9:41 AM 12/09/2023    9:31 AM 12/09/2023    9:17 AM  Vitals with BMI  Height 5' 11  5' 11  Weight 156 lbs 6 oz  166 lbs  BMI 21.82  23.16  Systolic 160 146 835  Diastolic 92 91 92  Pulse 73  82     Gen: no distress, normal appearing HEENT: oral mucosa pink and moist, NCAT Chest: normal effort, normal rate of breathing Abd: soft, non-distended Ext: no edema Psych: pleasant, normal affect Skin: intact Neuro: Alert and oriented x4, follows commands, cranial nerves II through XII grossly intact, Moving all 4 extremities to gravity and resistance Altered sensation throughout his dorsal right foot  Musculoskeletal:  Facet loading - not checked today Pain over the right greater trochanter- not tested today L-spine paraspinal tenderness b/l R>L TTP right ankle and proximal foot     MRI L  spine 09/17/21  FINDINGS: No significant interval change.   For the purposes of this dictation, it is assumed that the most caudal fully-segmented lumbar-type vertebra is L5, and that the most caudal fully-developed intervertebral disc is L5-S1.   Degenerative disc disease (DDD) and facet arthropathy (see below).   Vertebral alignment: Slight retrolisthesis of L3 on L4, as before.   Marrow signal: No significant abnormality.  Vertebral body heights: Normal.  Conus medullaris: Terminates at a normal level. Normal in size and signal intensity.   Individual disc levels:   T12-L1: Normal.  L1-2: Normal.  L2-3: Normal.   L3-4: Degenerative changes, including slight retrolisthesis and a small left foraminal zone disc-osteophyte complex. Mild left neural foraminal stenosis. No significant spinal canal or right neural foraminal stenosis. Appearance similar to prior.   L4-5: Mild degenerative changes, including a small right foraminal zone disc-osteophyte complex. No significant central zone stenosis. Mild bilateral subarticular zone stenosis. Mild right neural foraminal stenosis. No significant left neural foraminal stenosis. Appearance similar to prior.   L5-S1: Mild degenerative changes. Small left subarticular zone disc protrusion, as before, with impingement on the descending left S1 nerve root. Overall moderate left subarticular zone stenosis. No significant central zone stenosis. Mild right subarticular zone stenosis. No significant neural foraminal stenosis.   Xray R hip 08/05/21 Standing pelvis and right hip films taken and reviewed.  He has  well-fixed, well-positioned bilateral hip replacement implants, right side  has a dual mobility articulation.  No grossly complicating features    R foot 12/11/21             FINDINGS: Screws throughout all 5 digits traversing the interphalangeal joints. Some fusion of the first digit interphalangeal joint noted. Ankle joint arthroplasty. There is  no evidence of fracture or dislocation. No cortical erosion or destruction. Soft tissues are unremarkable.   IMPRESSION: Screws throughout all 5 digits traversing the interphalangeal joints.     R ankle xray 05/08/2019 report appropriate maintenance of alignment and stable overall TAR construct, with no  evidence of hardware complication or prosthetic loosening.  Forefoot  reconstruction surgery is healing nicely with excellent alignment to the         Assessment & Plan:   Chronic ankle pain in s/p multiple ankle surgeries -Continue meloxicam  current dose -Continue Tylenol   as needed -UDS and pain contract completed prior visit -Continue oxycodone  5 mg up to 6 times a day as needed-ordered refills  -Consider transition to long acting medication/or oxycodone  7.5 mg 4 times a day - Change gabapentin  to 800mg  TID to Lyrica  200 mg twice daily for neuropathic pain component -Cymbalta- and amitriptyline made him feel bad and  in the past.  -Mod opiod risk tool -Monitor UDS, pill counts, PDMP -Narcan  ordered for safety previously  -TENS unit did help -continue -Consider Qutenza for neuropathic pain dorsal right foot, likely just 1 patch --Discussed Qutenza as an option for neuropathic pain control. Discussed that this is a capsaicin patch, stronger than capsaicin cream. Discussed that it is currently approved for diabetic peripheral neuropathy and post-herpetic neuralgia, but that it has also shown benefit in treating other forms of neuropathy. Provided patient with link to site to learn more about the patch: https://www.clark.biz/. Discussed that the patch would be placed in office and benefits usually last 3 months. Discussed that unintended exposure to capsaicin can cause severe irritation of eyes, mucous membranes, respiratory tract, and skin, but that Qutenza is a local treatment and does not have the systemic side effects of other nerve medications. Discussed that there may be pain,  itching, erythema, and decreased sensory function associated with the application of Qutenza. Side effects usually subside within 1 week. A cold pack of analgesic medications can help with these side effects. Blood pressure can also be increased due to pain associated with administration of the patch.    Chronic lower back pain with lumbar spondylosis and left-sided radicular pain -Interventional procedures for his back being done at [facility] - he gets Facet joint L spine nerve ablations.  -Continue treatment as above   Hip pain greater on the right s/p bilateral hip replacements -Continue meloxicam    Left knee pain, suspect OA -Appears patient completed x-ray with Ortho, he did have a picture of this that he uploaded and it does appear to show OA to the medial compartment -Knee joint viscosupplementation,  limited by cost  -Zilretta would be an alternative medication to try   HTN, a little elevated today -F/u with PCP

## 2024-03-03 IMAGING — DX DG FOOT COMPLETE 3+V*R*
3 series · 3 of 3 positions shown · non-contrast
Comparison: X-ray right foot 11/05/2021, x-ray right foot 10/08/21

CLINICAL DATA: post op

EXAM:
RIGHT FOOT COMPLETE - 3+ VIEW

[foot ap wb]
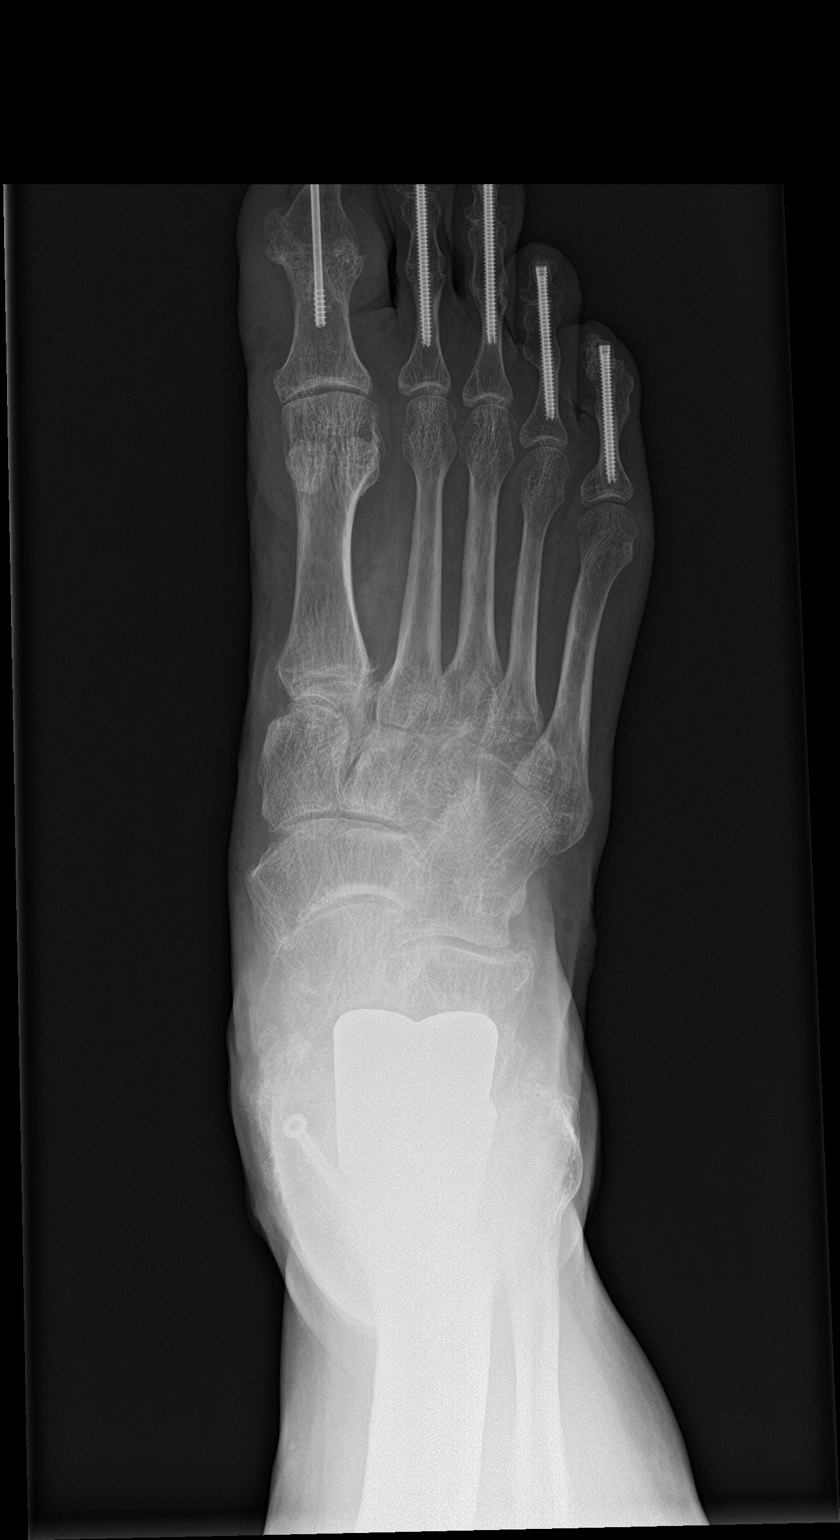

[foot obl wb]
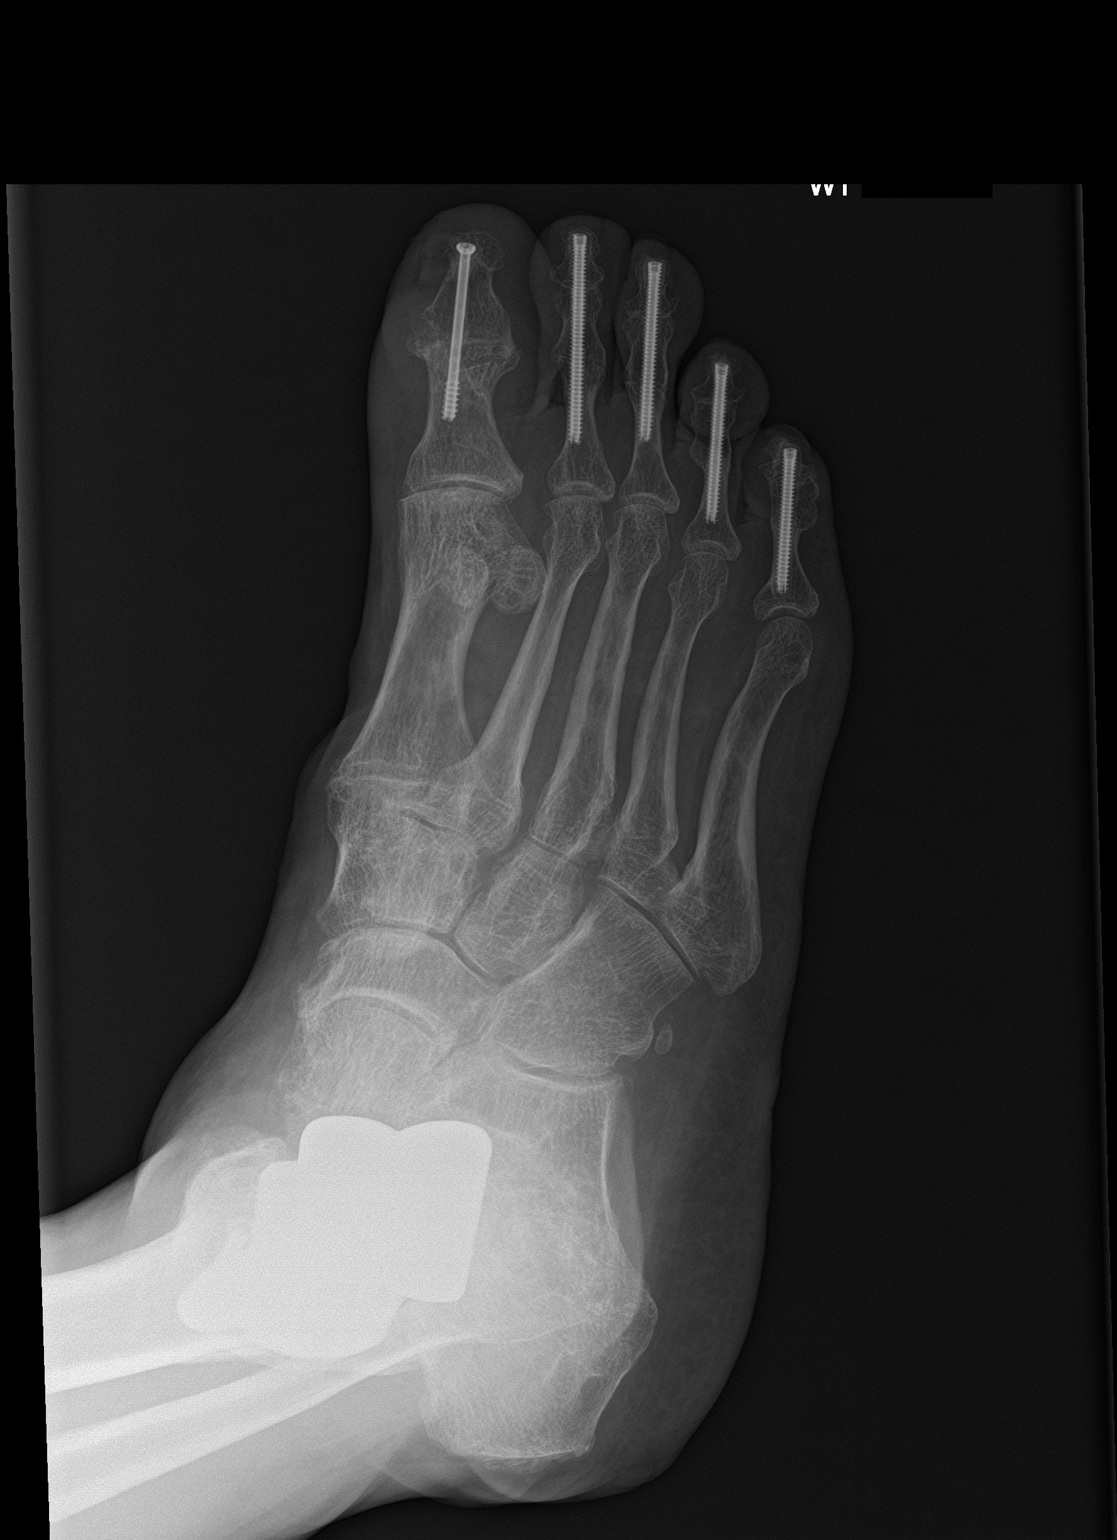

[foot lat wb]
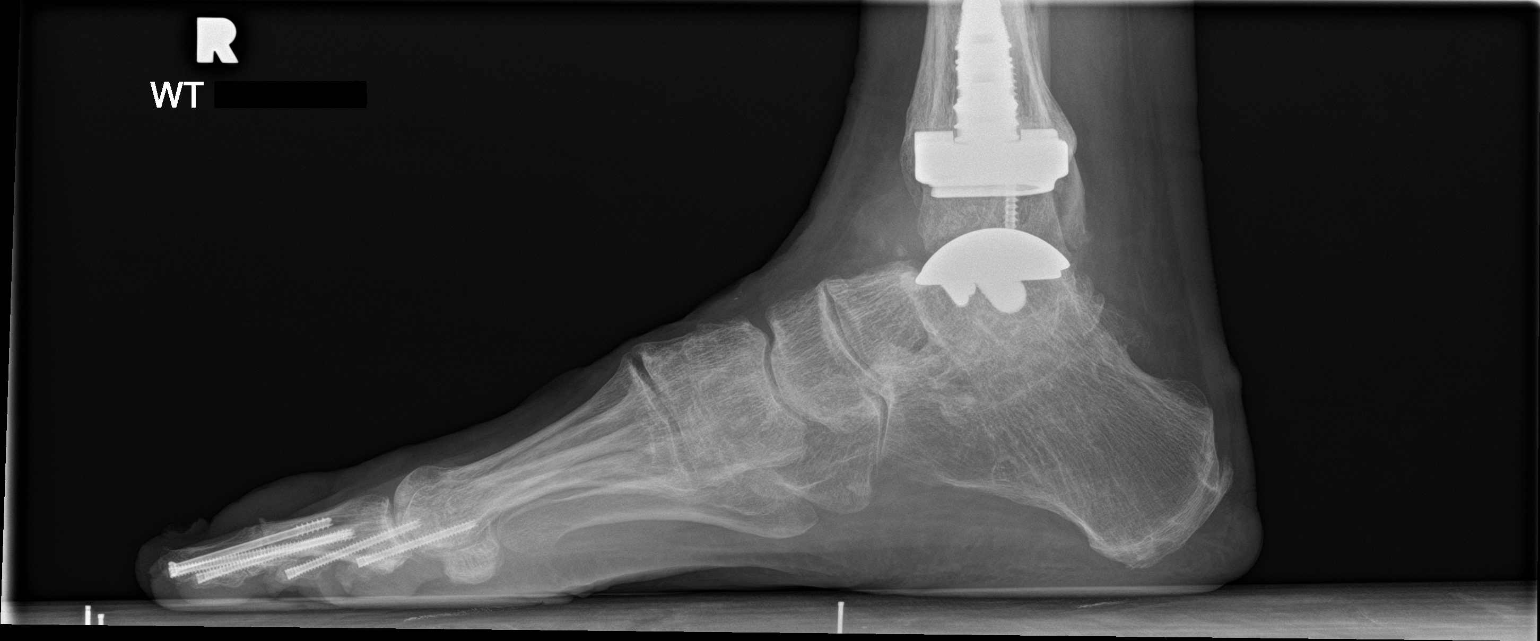

[3 of 3 positions shown; findings below may reference images not displayed]

FINDINGS: Screws throughout all 5 digits traversing the interphalangeal
joints. Some fusion of the first digit interphalangeal joint noted.
Ankle joint arthroplasty. There is no evidence of fracture or
dislocation. No cortical erosion or destruction. Soft tissues are
unremarkable.
IMPRESSION: Screws throughout all 5 digits traversing the interphalangeal
joints.

## 2024-03-05 ENCOUNTER — Encounter: Payer: Self-pay | Admitting: Physical Medicine & Rehabilitation

## 2024-03-05 MED ORDER — OXYCODONE HCL 5 MG PO TABS: 5.0000 mg | ORAL_TABLET | ORAL | 0 refills | Status: DC | PRN

## 2024-03-27 MED ORDER — GABAPENTIN 800 MG PO TABS: 800.0000 mg | ORAL_TABLET | Freq: Three times a day (TID) | ORAL | 2 refills | Status: DC

## 2024-04-01 ENCOUNTER — Encounter: Payer: Self-pay | Admitting: Physical Medicine & Rehabilitation

## 2024-04-03 MED ORDER — OXYCODONE HCL 5 MG PO TABS: 5.0000 mg | ORAL_TABLET | ORAL | 0 refills | Status: DC | PRN

## 2024-04-09 ENCOUNTER — Other Ambulatory Visit: Payer: Self-pay | Admitting: Physical Medicine & Rehabilitation

## 2024-04-09 ENCOUNTER — Encounter: Payer: Self-pay | Admitting: Physical Medicine & Rehabilitation

## 2024-04-09 ENCOUNTER — Encounter: Attending: Physical Medicine & Rehabilitation | Admitting: Physical Medicine & Rehabilitation

## 2024-04-09 VITALS — BP 164/94 | HR 73

## 2024-04-09 DIAGNOSIS — Z79891 Long term (current) use of opiate analgesic: Secondary | ICD-10-CM | POA: Diagnosis present

## 2024-04-09 DIAGNOSIS — M79671 Pain in right foot: Secondary | ICD-10-CM | POA: Insufficient documentation

## 2024-04-09 DIAGNOSIS — G8929 Other chronic pain: Secondary | ICD-10-CM | POA: Diagnosis present

## 2024-04-09 DIAGNOSIS — Z5181 Encounter for therapeutic drug level monitoring: Secondary | ICD-10-CM | POA: Diagnosis present

## 2024-04-09 DIAGNOSIS — G894 Chronic pain syndrome: Secondary | ICD-10-CM | POA: Insufficient documentation

## 2024-04-09 DIAGNOSIS — M1712 Unilateral primary osteoarthritis, left knee: Secondary | ICD-10-CM | POA: Insufficient documentation

## 2024-04-09 DIAGNOSIS — M25571 Pain in right ankle and joints of right foot: Secondary | ICD-10-CM | POA: Insufficient documentation

## 2024-04-09 MED ORDER — GABAPENTIN 400 MG PO TABS: 400.0000 mg | ORAL_TABLET | Freq: Three times a day (TID) | ORAL | 11 refills | Status: AC

## 2024-04-09 MED ORDER — OXYCODONE-ACETAMINOPHEN 7.5-325 MG PO TABS: 1.0000 | ORAL_TABLET | Freq: Four times a day (QID) | ORAL | 0 refills | Status: DC | PRN

## 2024-04-09 NOTE — Progress Notes (Signed)
 Subjective:    Patient ID: Barry Beltran, male    DOB: [birth year], 48 y.o.   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  Patient reports much of his pain is due to his prior surgeries.  Patient reports he had an ACL injury of his right knee which required surgery in 2015.  He then had a fusion of his right foot/ankle in 2016 that required repeat surgery in 2017.  He then had bone spurs removed from his right foot in 2017.  Reports he developed a required ankle replacement surgery in 2018 and then a Hallux valgus surgery March 2023.  Patient reports he developed left hip pain requiring replacement in 2021 and later right hip pain that was replaced shortly after.  He had a right hip dislocation and required an additional surgery for this.  Barry Beltran reports that his greatest pain in his right ankle and right hip.  He also has back pain due to herniated disks and reports a history of 18 epidural injections.  He reports occasional shooting pain down his left lower extremity.  Pain is constant sharp and burning.  He has had multiple rounds of physical therapy with some improvement.  To help relieve his pain he uses Tylenol  1 g 3 times a day.  He also takes meloxicam  15 mg, and gabapentin  300 mg 3 times daily.  Patient is also been getting oxycodone  and occasionally tramadol.  He takes oxycodone  about 5 mg 4-5 times a day and this helps to control his pain.  He reports this allows him to be more active at home and to walk much further.  Gabapentin  dose is limited by sedation at higher doses.  He also wears a brace on his right foot often   Visit 05/31/22 Barry Beltran is here for follow-up of his chronic pain.  Pain continues to be worse in his right ankle.  Walking  continues to worsen the pain.  He continues to also have pain in his lower back will sometimes shoot down his left leg.  He also has pain in his hip on the right.  Patient reports that oxycodone  5 mg 6 times a day is keeping the pain under fairly good control.  And is allowing him to spend time with his family and be more active at home.  He says his pain did get worse a few weeks ago when he stopped using the THC Gummies.  He is not having any side effects the oxycodone .       Visit 05/29/2019 Barry Beltran is here for follow-up of his chronic pain.  He continues to have his worst pain in his right ankle.  This is worsened with ambulation.  He also is having pain in his lower back that shoots into his left thigh.  Oxycodone  5 mg 6 times a day as keeping the pain fairly well controlled.  He is not having any side effects with the medication.  He is not using THC Gummies anymore. Patient reports he is scheduled with [facility] to have repeat injections in his back.  He pulled he noted up on his phone where he had a  L4/5L 5/S1 medial brach block completed earlier this year.  He reports his procedure helped  the pain for over 6 months.  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    Interval history 07/27/2022 Barry Beltran is here for follow-up regarding his chronic pain.  He reports his foot and ankle pain has been worse recently.  His other areas of pain such as back and right hip are about the same.  He will occasionally have shooting pains on his left hip and thigh that worsens with activity.  He is not having this pain at this time.  He says he feels like his toenail on his right foot great toe is being squeezed or pulled off even though he no longer has the toenail here.  The oxycodone  continues to help however does not last as long as it previously did.  He is not having any side effects with this medication.  He reports he is still following with [facility] and is  considering repeat of the medial branch blocks.  He also reports they were talking to him about a possible implantable device in his back, spinal cord stimulator?  He reports pain has been limiting his activities and he is not able to do as much as he previously was able to.  He continues to take gabapentin  600 mg twice daily.  It is not causing him any significant sedation or other side effects.  Reports TENS unit did not help previously.     Interval history 09/28/22 Barry Beltran is here for follow-up regarding treatment of his chronic pain.  Patient reports since the increase gabapentin  the uncomfortable sensation around his skin has improved.  Oxycodone  continues to help him with his pain, however not quite as well as it did at the beginning.  Overall pain is controlled and he is able to function better with the medication.  Medication helps him spend better quality time with his family.  He is also taking several classes regarding his employment, and plans to travel to [place] shortly to take a class.  Patient reports he had a nerve ablation for his lower back completed, reports some improvement with this treatment.     Interval History 11/25/22 Barry Beltran is here for follow-up regarding his back hip and ankle pain.  Oxycodone  continues to keep his pain under control.  It is not as strong as it used to be however he does report that he is doing more than he used to.  He is able to spend time with his kids, exercise more, work on gun smithing and do more around the house.  Gabapentin  continues to help his nerve pain at his incision sites.  He denies any side effects with medications.  He has been having some increased pain in his left knee for the past several weeks.  Interval History 01/25/23 Barry Beltran is here for follow-up regarding his chronic pain.  He reports that his hip has been a little better recently.  He continues to have back pain and right ankle pain.  He has also been having a lot of pain in his  left knee.  He had a x-ray completed by Ortho over his left knee.  Patient uploaded images of this x-ray and it does appear to show OA.  He had cortisone injection completed by Ortho 12/03/2022.  The cortisone injection left knee helped for about a month but knee pain has returned.  Knee pain is worse with activity.  Although oxycodone  is not helping as much as it used to, he still feels that  it provides benefit and helps keep his pain tolerable.  He also feels that gabapentin  reducing his nerve pain, however pain continues to be uncomfortable at times.  No side effects of medications.  Interval History 04/22/23 Barry Beltran is here for follow-up regarding his chronic pain in multiple areas.  He reports lately his knee has been hurting a lot.  Pain relief with cortisone did not last long enough, he is interested in trying viscosupplementation.  Pain in other areas such as his right ankle and lower back is unchanged from prior visit.  He continues to use oxycodone  5 mg for pain control.  This provides benefit and does not cause any side effects.   Interval History 06/20/2023 Barry Beltran is here for follow-up regarding his chronic pain.  He reports he continues to have pain but current medications are keeping it under control.   No side effects with medications.He reports he is much more active with current regimen than he was previously.  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  His right foot pain has been worse recently due to the cold weather.  His left knee has not been bothering him as much.  He also decided not to do knee injection with gel due to the cost of this injection.   Interval History 10/14/23 Pain overall unchanged from last few visits. Pain improving a little with warmer weather. Oxycodone  5mg  helping keep pain controlled. Reports he had recent flu and family had covid, now doing better. NO side effects with the medications.  No new concerns.  Patient reports  he realized gabapentin  helping more then he previously realized.   Interval History 12/09/23 Pain overall controlled with current medication. Gabapentin  and oxycodone  keeping his pain tolerable. He remains active, working on [employer].  No side effects with the medication.  R ankle and foot tingling burning pain is his greatest area of pain. He has back pain more on the right side, left L spine pain improved with injections at [facility]. L knee pain is doing ok.  It sometimes hurts with activity at medial joint line.     Interval History 02/06/2024 Patient has been having increased pain in his right foot.  Patient reports this feels like nerve pain.  It is of stinging, numb, burning, tingling and shocking quality.  Pain is over his dorsal right foot and over his toes.  He reports its definitely nerve pain .  He reports gabapentin  was helping initially when he started it but he feels like it is not controlling the pain as well currently.  Oxycodone  as needed continues to help his overall pain and he is not having any side effects with this medication.  Interval History 04/09/24 Reports a trial of Lyrica  for over a month was ineffective for pain and worsened foot pain. Has since switched back to gabapentin , which is providing better relief, although not as effective as it used to be. Suggests a tolerance may have developed. Foot pain is described as a nerve-like pain, localized to the great toe and the surrounding bone.  Overall, no new issues. Reports life is good.  Knee pain is ongoing, described as sore. The area is swollen compared to the contralateral side. Last cortisone injection was on 01/23/2024 and provided good relief for about two weeks before symptoms returned.  - Gabapentin : Currently taking 800 mg three times daily. Reports it helps calm the pain down, but the effect is less than  it was previously. - Lyrica : Trialed for one month, discontinued due to inefficacy and worsening  foot and shoulder pain. - Oxycodone : Reports it is okay, but not as effective as it was 1.5 years ago. Not increased dose since starting. - Qutenza patch: Discussed for nerve pain, but insurance will not cover it. - Cymbalta (duloxetine): Previously tried and reported feeling unwell. Not interested in a retrial at this time.   Pain Inventory Average Pain 7 Pain Right Now 6  My pain is sharp, burning, stabbing, tingling, and aching  In the last 24 hours, has pain interfered with the following? General activity 2,3  Relation with others 0 Enjoyment of life 0 What TIME of day is your pain at its worst? morning  and evening Sleep (in general) Good  Pain is worse with: bending and some activites Pain improves with: heat/ice, pacing activities, medication, and injections Relief from Meds: 7  Family History  Problem Relation Age of Onset   Hypertension Mother    Social History   Socioeconomic History   Marital status: Married    Spouse name: Not on file   Number of children: Not on file   Years of education: Not on file   Highest education level: Not on file  Occupational History   Not on file  Tobacco Use   Smoking status: Every Day    Current packs/day: 0.80    Types: Cigarettes   Smokeless tobacco: Never  Vaping Use   Vaping status: Never Used  Substance and Sexual Activity   Alcohol use: Yes    Comment: rarely   Drug use: No   Sexual activity: Not on file  Other Topics Concern   Not on file  Social History Narrative   Not on file   Social Drivers of Health   Financial Resource Strain: Patient Declined (08/09/2022)   Received from [facility]   Overall Financial Resource Strain (CARDIA)    Difficulty of Paying Living Expenses: Patient declined  Food Insecurity: Low Risk  (03/06/2024)   Received from [facility]   Hunger Vital Sign    Within the past 12 months, you worried that your food would run out before you got money to buy more: Never true     Within the past 12 months, the food you bought just didn't last and you didn't have money to get more. : Never true  Transportation Needs: No Transportation Needs (03/06/2024)   Received from [facility]    In the past 12 months, has lack of reliable transportation kept you from medical appointments, meetings, work or from getting things needed for daily living? : No  Physical Activity: Sufficiently Active (08/09/2022)   Received from [facility]   Exercise Vital Sign    On average, how many days per week do you engage in moderate to strenuous exercise (like a brisk walk)?: 7 days    On average, how many minutes do you engage in exercise at this level?: 60 min  Stress: No Stress Concern Present (08/09/2022)   Received from [facility] - Occupational Stress Questionnaire    Feeling of Stress : Not at all  Recent Concern: Stress - Stress Concern Present (05/18/2022)   Received from [facility] - Occupational Stress Questionnaire    Feeling of Stress : To some extent  Social Connections: Socially Integrated (08/09/2022)   Received from [facility]   Social Network  How would you rate your social network (family, work, friends)?: Good participation with social networks   No past surgical history on file. No past surgical history on file. Past Medical History:  Diagnosis Date   Hypertension    There were no vitals taken for this visit.  Opioid Risk Score:   Fall Risk Score:  `1  Depression screen [facility] 2/9     02/06/2024    9:39 AM 12/09/2023    9:24 AM 10/14/2023    9:43 AM 06/20/2023    1:02 PM 04/22/2023   11:14 AM 01/25/2023    9:33 AM 11/25/2022    9:22 AM  Depression screen PHQ 2/9  Decreased Interest 0 0 0 0 0 0 0  Down, Depressed, Hopeless 0 0 0 0 0 0 0  PHQ - 2 Score 0 0 0 0 0 0 0    Review of Systems  Constitutional: Negative.   HENT: Negative.    Eyes:  Negative.   Respiratory: Negative.    Cardiovascular: Negative.   Gastrointestinal: Negative.   Endocrine: Negative.   Genitourinary: Negative.   Musculoskeletal:  Positive for back pain and gait problem.       Right foot pain, right hip pain, right knee pain  Skin: Negative.   Allergic/Immunologic: Negative.   Hematological: Negative.   Psychiatric/Behavioral: Negative.    All other systems reviewed and are negative.      Objective:   Physical Exam     04/09/2024   10:06 AM 04/09/2024   10:05 AM 02/06/2024    9:41 AM  Vitals with BMI  Height   5' 11  Weight   156 lbs 6 oz  BMI   21.82  Systolic 164 167 839  Diastolic 94 95 92  Pulse  73 73     Gen: no distress, normal appearing HEENT: oral mucosa pink and moist, NCAT Chest: normal effort, normal rate of breathing Abd: soft, non-distended Ext: no edema Psych: pleasant, normal affect Skin: intact Neuro: Alert and oriented x4, follows commands, cranial nerves II through XII grossly intact, Moving all 4 extremities to gravity and resistance  Musculoskeletal:  Facet loading - not checked today Pain over the right greater trochanter- not tested today L-spine paraspinal tenderness b/l R>L TTP right ankle and proximal foot, altered sensation over his foot L knee Tenderness to palpation   MRI L spine 09/17/21  FINDINGS: No significant interval change.   For the purposes of this dictation, it is assumed that the most caudal fully-segmented lumbar-type vertebra is L5, and that the most caudal fully-developed intervertebral disc is L5-S1.   Degenerative disc disease (DDD) and facet arthropathy (see below).   Vertebral alignment: Slight retrolisthesis of L3 on L4, as before.   Marrow signal: No significant abnormality.  Vertebral body heights: Normal.  Conus medullaris: Terminates at a normal level. Normal in size and signal intensity.   Individual disc levels:   T12-L1: Normal.  L1-2: Normal.  L2-3: Normal.    L3-4: Degenerative changes, including slight retrolisthesis and a small left foraminal zone disc-osteophyte complex. Mild left neural foraminal stenosis. No significant spinal canal or right neural foraminal stenosis. Appearance similar to prior.   L4-5: Mild degenerative changes, including a small right foraminal zone disc-osteophyte complex. No significant central zone stenosis. Mild bilateral subarticular zone stenosis. Mild right neural foraminal stenosis. No significant left neural foraminal stenosis. Appearance similar to prior.   L5-S1: Mild degenerative changes. Small left subarticular zone disc protrusion, as before, with impingement on  the descending left S1 nerve root. Overall moderate left subarticular zone stenosis. No significant central zone stenosis. Mild right subarticular zone stenosis. No significant neural foraminal stenosis.   Xray R hip 08/05/21 Standing pelvis and right hip films taken and reviewed.  He has  well-fixed, well-positioned bilateral hip replacement implants, right side  has a dual mobility articulation.  No grossly complicating features    R foot 12/11/21             FINDINGS: Screws throughout all 5 digits traversing the interphalangeal joints. Some fusion of the first digit interphalangeal joint noted. Ankle joint arthroplasty. There is no evidence of fracture or dislocation. No cortical erosion or destruction. Soft tissues are unremarkable.   IMPRESSION: Screws throughout all 5 digits traversing the interphalangeal joints.     R ankle xray 05/08/2019 report appropriate maintenance of alignment and stable overall TAR construct, with no  evidence of hardware complication or prosthetic loosening.  Forefoot  reconstruction surgery is healing nicely with excellent alignment to the         Assessment & Plan:   Chronic ankle pain in s/p multiple ankle surgeries -Continue meloxicam  current dose -Continue Tylenol  as needed -UDS and pain contract  completed prior visit -Continue oxycodone  5 mg up to 6 times a day as needed until next refill -Transition  oxycodone  7.5 mg 4 times a day- will start with 2 week order to see how he does - Change gabapentin  to 800mg  TID tto 1200mg  TID -Poor response to lyrica  -Cymbalta- and amitriptyline made him feel bad and  in the past.  -Mod opiod risk tool -Monitor UDS, pill counts, PDMP -Narcan  ordered for safety previously  -TENS unit did help -continue -Consider Qutenza for neuropathic pain dorsal right foot, likely just 1 patch --Discussed Qutenza as an option for neuropathic pain control. Pt concerned with cost of medication.     Chronic lower back pain with lumbar spondylosis and left-sided radicular pain -Interventional procedures for his back being done at [facility] - he gets Facet joint L spine nerve ablations.  -Continue treatment as above   Hip pain greater on the right s/p bilateral hip replacements -Continue meloxicam    Left knee pain, suspect OA -Appears patient completed x-ray with Ortho, he did have a picture of this that he uploaded and it does appear to show OA to the medial compartment -Knee joint viscosupplementation,  limited by cost  -Discussed Zilretta (long-acting cortisone) injection as an option for longer-lasting relief. Will schedule for a Zilretta injection in two months, pending insurance verification of coverage. Patient will check the cost first. If not feasible, will proceed with a standard visit. The last injection was 01/23/2024, so will be clear of the three-month waiting period.  HTN, a little elevated today -F/u with PCP

## 2024-04-13 LAB — DRUG TOX MONITOR 1 W/CONF, ORAL FLD
Amobarbital: NEGATIVE ng/mL (ref <10)
Amphetamines: NEGATIVE ng/mL (ref <10)
Barbiturates: POSITIVE ng/mL — AB (ref <10)
Benzodiazepines: NEGATIVE ng/mL (ref <0.50)
Buprenorphine: NEGATIVE ng/mL (ref <0.10)
Butalbital: NEGATIVE ng/mL (ref <10)
Cocaine: NEGATIVE ng/mL (ref <5.0)
Codeine: NEGATIVE ng/mL (ref <2.5)
Cotinine: >250 ng/mL — ABNORMAL HIGH (ref <5.0)
Dihydrocodeine: NEGATIVE ng/mL (ref <2.5)
Fentanyl: NEGATIVE ng/mL (ref <0.10)
Heroin Metabolite: NEGATIVE ng/mL (ref <1.0)
Hydrocodone: NEGATIVE ng/mL (ref <2.5)
Hydromorphone: NEGATIVE ng/mL (ref <2.5)
MARIJUANA: NEGATIVE ng/mL (ref <2.5)
MDMA: NEGATIVE ng/mL (ref <10)
Meprobamate: NEGATIVE ng/mL (ref <2.5)
Methadone: NEGATIVE ng/mL (ref <5.0)
Morphine: NEGATIVE ng/mL (ref <2.5)
Nicotine Metabolite: POSITIVE ng/mL — AB (ref <5.0)
Norhydrocodone: NEGATIVE ng/mL (ref <2.5)
Noroxycodone: 13.6 ng/mL — ABNORMAL HIGH (ref <2.5)
Opiates: POSITIVE ng/mL — AB (ref <2.5)
Oxycodone: >250 ng/mL — ABNORMAL HIGH (ref <2.5)
Oxymorphone: NEGATIVE ng/mL (ref <2.5)
Pentobarbital: NEGATIVE ng/mL (ref <10)
Phencyclidine: NEGATIVE ng/mL (ref <10)
Phenobarbital: 19 ng/mL — ABNORMAL HIGH (ref <10)
THC: NEGATIVE ng/mL (ref <2.5)
Tapentadol: NEGATIVE ng/mL (ref <5.0)
Tramadol: NEGATIVE ng/mL (ref <5.0)
Zolpidem: NEGATIVE ng/mL (ref <5.0)

## 2024-04-13 LAB — DRUG TOX ALC METAB W/CON, ORAL FLD: Alcohol Metabolite: NEGATIVE ng/mL (ref <25)

## 2024-05-13 ENCOUNTER — Encounter: Payer: Self-pay | Admitting: Physical Medicine & Rehabilitation

## 2024-05-14 ENCOUNTER — Telehealth: Payer: Self-pay | Admitting: Registered Nurse

## 2024-05-14 DIAGNOSIS — G8929 Other chronic pain: Secondary | ICD-10-CM

## 2024-05-14 DIAGNOSIS — G894 Chronic pain syndrome: Secondary | ICD-10-CM

## 2024-05-14 MED ORDER — OXYCODONE-ACETAMINOPHEN 7.5-325 MG PO TABS: 1.0000 | ORAL_TABLET | Freq: Four times a day (QID) | ORAL | 0 refills | Status: DC | PRN

## 2024-05-14 NOTE — Telephone Encounter (Signed)
PMP was Reviewed.  Dr Natale Lay note was reviewed.

## 2024-06-08 ENCOUNTER — Encounter: Payer: Self-pay | Admitting: Physical Medicine & Rehabilitation

## 2024-06-10 ENCOUNTER — Other Ambulatory Visit: Payer: Self-pay | Admitting: Physical Medicine & Rehabilitation

## 2024-06-10 DIAGNOSIS — G8929 Other chronic pain: Secondary | ICD-10-CM

## 2024-06-10 DIAGNOSIS — G894 Chronic pain syndrome: Secondary | ICD-10-CM

## 2024-06-11 ENCOUNTER — Other Ambulatory Visit: Payer: Self-pay | Admitting: *Deleted

## 2024-06-11 ENCOUNTER — Encounter: Admitting: Physical Medicine & Rehabilitation

## 2024-06-12 ENCOUNTER — Telehealth: Payer: Self-pay

## 2024-06-12 ENCOUNTER — Telehealth: Payer: Self-pay | Admitting: Registered Nurse

## 2024-06-12 DIAGNOSIS — G894 Chronic pain syndrome: Secondary | ICD-10-CM

## 2024-06-12 DIAGNOSIS — G8929 Other chronic pain: Secondary | ICD-10-CM

## 2024-06-12 MED ORDER — OXYCODONE-ACETAMINOPHEN 7.5-325 MG PO TABS: 1.0000 | ORAL_TABLET | Freq: Four times a day (QID) | ORAL | 0 refills | Status: DC | PRN

## 2024-06-12 MED ORDER — GABAPENTIN 800 MG PO TABS: 800.0000 mg | ORAL_TABLET | Freq: Three times a day (TID) | ORAL | 5 refills | Status: AC

## 2024-06-12 NOTE — Telephone Encounter (Signed)
 PDMP was Reviewed. Dr Ulysees note was reviewed. Oxycodone  and gabapentin  e-scribed to pharmacy.  Barry Beltran appointment has been re-scheduled due to Dr Ulysees scheduled. He was instructed to send a My-Chart message to Dr Urbano on 12/ 02/2024, his last office visit was in September.  This message will be forward to Dr Urbano as well. He reports he was supposed to have knee injection.

## 2024-06-12 NOTE — Telephone Encounter (Addendum)
 Patient has 3-4 day of Gabapentin  on hand. He does not have any Oxycodone  7.5 - 325 mg on hand.   Will you please send pain medication coverage in for him? Per patient his appointments were canceled and rescheduled by the office. [phone]).  Dr. Urbano is off please advise.

## 2024-06-19 ENCOUNTER — Encounter: Payer: Self-pay | Admitting: Physical Medicine & Rehabilitation

## 2024-06-25 ENCOUNTER — Encounter: Payer: Self-pay | Admitting: Physical Medicine & Rehabilitation

## 2024-07-09 ENCOUNTER — Encounter: Attending: Registered Nurse | Admitting: Registered Nurse

## 2024-07-09 ENCOUNTER — Encounter: Payer: Self-pay | Admitting: Registered Nurse

## 2024-07-09 VITALS — BP 131/91 | HR 102 | Ht 71.0 in | Wt 156.0 lb

## 2024-07-09 DIAGNOSIS — M25551 Pain in right hip: Secondary | ICD-10-CM | POA: Diagnosis present

## 2024-07-09 DIAGNOSIS — G894 Chronic pain syndrome: Secondary | ICD-10-CM | POA: Diagnosis present

## 2024-07-09 DIAGNOSIS — Z79891 Long term (current) use of opiate analgesic: Secondary | ICD-10-CM | POA: Diagnosis present

## 2024-07-09 DIAGNOSIS — M1712 Unilateral primary osteoarthritis, left knee: Secondary | ICD-10-CM | POA: Diagnosis present

## 2024-07-09 DIAGNOSIS — M79671 Pain in right foot: Secondary | ICD-10-CM | POA: Insufficient documentation

## 2024-07-09 DIAGNOSIS — M25571 Pain in right ankle and joints of right foot: Secondary | ICD-10-CM | POA: Diagnosis present

## 2024-07-09 DIAGNOSIS — Z5181 Encounter for therapeutic drug level monitoring: Secondary | ICD-10-CM | POA: Diagnosis present

## 2024-07-09 DIAGNOSIS — M47816 Spondylosis without myelopathy or radiculopathy, lumbar region: Secondary | ICD-10-CM | POA: Insufficient documentation

## 2024-07-09 DIAGNOSIS — G8929 Other chronic pain: Secondary | ICD-10-CM | POA: Insufficient documentation

## 2024-07-09 MED ORDER — OXYCODONE-ACETAMINOPHEN 7.5-325 MG PO TABS: 1.0000 | ORAL_TABLET | Freq: Four times a day (QID) | ORAL | 0 refills | Status: DC | PRN

## 2024-07-09 NOTE — Progress Notes (Signed)
 "  Subjective:    Patient ID: Barry Beltran, male    DOB: 06-24-1976, 48 y.o.   MRN: [id number]  HPI: Barry Beltran is a 48 y.o. male who returns for follow up appointment for chronic pain and medication refill. He states his pain is located in his lower back, right hip, left knee, Right ankle and right foot pain. He rates his pain 8. His current exercise regime is walking short distances  and performing stretching exercises.  Mr. Aigner arrived to office tachycardic, apical pulse checked. He will keep a vital flowsheet and F/U with PCP, he verbalizes understanding.   Mr. Utz Morphine equivalent is 45.00 MME.  Oral Swab performed today.     Pain Inventory Average Pain 7 Pain Right Now 8 My pain is constant, sharp, burning, stabbing, tingling, and aching  In the last 24 hours, has pain interfered with the following? General activity 2 Relation with others 0 Enjoyment of life 0 What TIME of day is your pain at its worst? night Sleep (in general) Fair  Pain is worse with: inactivity and some activites Pain improves with: heat/ice, pacing activities, and medication Relief from Meds: 8  Family History  Problem Relation Age of Onset   Hypertension Mother    Social History   Socioeconomic History   Marital status: Married    Spouse name: Not on file   Number of children: Not on file   Years of education: Not on file   Highest education level: Not on file  Occupational History   Not on file  Tobacco Use   Smoking status: Every Day    Current packs/day: 0.80    Types: Cigarettes   Smokeless tobacco: Never  Vaping Use   Vaping status: Never Used  Substance and Sexual Activity   Alcohol use: Yes    Comment: rarely   Drug use: No   Sexual activity: Not on file  Other Topics Concern   Not on file  Social History Narrative   Not on file   Social Drivers of Health   Tobacco Use: High Risk (07/09/2024)   Patient History    Smoking Tobacco Use: Every Day    Smokeless  Tobacco Use: Never    Passive Exposure: Not on file  Financial Resource Strain: Patient Declined (08/09/2022)   Received from [facility]   Overall Financial Resource Strain (CARDIA)    Difficulty of Paying Living Expenses: Patient declined  Food Insecurity: Low Risk (06/08/2024)   Received from [facility]   Epic    Within the past 12 months, the food you bought just didn't last and you didn't have money to get more. : Never true    Within the past 12 months, you worried that your food would run out before you got money to buy more: Never true  Transportation Needs: No Transportation Needs (06/08/2024)   Received from [facility]    In the past 12 months, has lack of reliable transportation kept you from medical appointments, meetings, work or from getting things needed for daily living? : No  Physical Activity: Sufficiently Active (08/09/2022)   Received from [facility]   Exercise Vital Sign    On average, how many days per week do you engage in moderate to strenuous exercise (like a brisk walk)?: 7 days    On average, how many minutes do you engage in exercise at this level?: 60 min  Stress: No Stress Concern Present (08/09/2022)   Received  from [facility] - Occupational Stress Questionnaire    Feeling of Stress : Not at all  Recent Concern: Stress - Stress Concern Present (05/18/2022)   Received from [facility] of [facility] - Occupational Stress Questionnaire    Feeling of Stress : To some extent  Social Connections: Socially Integrated (08/09/2022)   Received from [facility]   Social Network    How would you rate your social network (family, work, friends)?: Good participation with social networks  Depression (PHQ2-9): Low Risk (07/09/2024)   Depression (PHQ2-9)    PHQ-2 Score: 0  Alcohol Screen: Not on file  Housing: Low Risk (06/08/2024)   Received from [facility]    Epic    What is your living situation today?: I have a steady place to live    Think about the place you live. Do you have problems with any of the following? Choose all that apply:: None/None on this list  Utilities: Low Risk (06/08/2024)   Received from [facility]   Utilities    In the past 12 months has the electric, gas, oil, or water company threatened to shut off services in your home? : No  Health Literacy: Not on file   No past surgical history on file. No past surgical history on file. Past Medical History:  Diagnosis Date   Hypertension    BP (!) 131/91   Pulse (!) 108   Ht 5' 11 (1.803 m)   Wt 156 lb (70.8 kg)   SpO2 96%   BMI 21.76 kg/m   Opioid Risk Score:   Fall Risk Score:  `1  Depression screen [facility] 2/9     07/09/2024    8:46 AM 04/09/2024   10:07 AM 02/06/2024    9:39 AM 12/09/2023    9:24 AM 10/14/2023    9:43 AM 06/20/2023    1:02 PM 04/22/2023   11:14 AM  Depression screen PHQ 2/9  Decreased Interest 0 0 0 0 0 0 0  Down, Depressed, Hopeless 0 0 0 0 0 0 0  PHQ - 2 Score 0 0 0 0 0 0 0     Review of Systems  Musculoskeletal:  Positive for back pain and gait problem.       Left knee  All other systems reviewed and are negative.      Objective:   Physical Exam Vitals and nursing note reviewed.  Constitutional:      Appearance: Normal appearance.  Cardiovascular:     Rate and Rhythm: Tachycardia present.     Pulses: Normal pulses.     Heart sounds: Normal heart sounds.  Pulmonary:     Effort: Pulmonary effort is normal.     Breath sounds: Normal breath sounds.  Musculoskeletal:     Comments: Normal Muscle Bulk and Muscle Testing Reveals:  Upper Extremities: Full ROM and Muscle Strength 5/5 Lumbar Paraspinal Tenderness: L-4-L-5 Lower Extremities : Decreased ROM and Muscle Strength 4/5 Right Lower Extremity Flexion Produces Pain into his right ankle and right foor Left Lower Extremity Flexion Produces Pain into his left Patella and Left  Lower Extremity Arises from Table slowly  Antalgic Gait     Skin:    General: Skin is warm and dry.  Neurological:     Mental Status: He is alert and oriented to person, place, and time.  Psychiatric:        Mood and Affect: Mood normal.  Behavior: Behavior normal.          Assessment & Plan:  Lumbar Spondylosis: Continue HEP as Tolerated. Continue current medication regimen. Continue to monitor. 07/09/2024 Chronic Right Hip Pain: Continue HEP as Tolerated. Continue current medication regimen. Continue to monitor. 07/09/2024 Chronic Right Foot Pain: Continue HEP a Tolerated. Continue Current medication regimen. Continue to Monitor. 07/09/2024 Chronic Right Ankle Pain: Continue HEP as Tolerated. Continue to Monitor. 07/09/2024 Left Knee Pain: He is scheduled for Zilretta Injection with Dr Urbano. Continue to monitor Chronic Pain Syndrome: Refilled: Oxycodone  7.5 mg every 6 hours as needed for paoin #120. We will continue the opioid monitoring program, this consists of regular clinic visits, examinations, urine drug screen, pill counts as well as use of [place]  Controlled Substance Reporting system. A 12 month History has been reviewed on the [place]  Controlled Substance Reporting System on 07/09/2024  F/U with Dr Murray   "

## 2024-07-13 LAB — DRUG TOX MONITOR 1 W/CONF, ORAL FLD
Amphetamines: NEGATIVE ng/mL
Barbiturates: NEGATIVE ng/mL
Benzodiazepines: NEGATIVE ng/mL
Buprenorphine: NEGATIVE ng/mL
Cocaine: NEGATIVE ng/mL
Codeine: NEGATIVE ng/mL
Cotinine: >250 ng/mL — ABNORMAL HIGH
Dihydrocodeine: NEGATIVE ng/mL
Fentanyl: NEGATIVE ng/mL
Heroin Metabolite: NEGATIVE ng/mL
Hydrocodone: NEGATIVE ng/mL
Hydromorphone: NEGATIVE ng/mL
MARIJUANA: NEGATIVE ng/mL
MDMA: NEGATIVE ng/mL
Meprobamate: NEGATIVE ng/mL
Methadone: NEGATIVE ng/mL
Morphine: NEGATIVE ng/mL
Nicotine Metabolite: POSITIVE ng/mL — AB
Norhydrocodone: NEGATIVE ng/mL
Noroxycodone: 19.4 ng/mL — ABNORMAL HIGH
Opiates: POSITIVE ng/mL — AB
Oxycodone: >250 ng/mL — ABNORMAL HIGH
Oxymorphone: NEGATIVE ng/mL
Phencyclidine: NEGATIVE ng/mL
THC: NEGATIVE ng/mL
Tapentadol: NEGATIVE ng/mL
Tramadol: NEGATIVE ng/mL
Zolpidem: NEGATIVE ng/mL

## 2024-07-13 LAB — DRUG TOX ALC METAB W/CON, ORAL FLD: Alcohol Metabolite: NEGATIVE ng/mL

## 2024-07-26 ENCOUNTER — Encounter: Attending: Registered Nurse | Admitting: Physical Medicine & Rehabilitation

## 2024-07-26 ENCOUNTER — Encounter: Payer: Self-pay | Admitting: Physical Medicine & Rehabilitation

## 2024-07-26 DIAGNOSIS — M1712 Unilateral primary osteoarthritis, left knee: Secondary | ICD-10-CM | POA: Diagnosis not present

## 2024-07-26 DIAGNOSIS — G8929 Other chronic pain: Secondary | ICD-10-CM | POA: Diagnosis present

## 2024-07-26 DIAGNOSIS — M47816 Spondylosis without myelopathy or radiculopathy, lumbar region: Secondary | ICD-10-CM | POA: Insufficient documentation

## 2024-07-26 DIAGNOSIS — G894 Chronic pain syndrome: Secondary | ICD-10-CM | POA: Insufficient documentation

## 2024-07-26 DIAGNOSIS — M25571 Pain in right ankle and joints of right foot: Secondary | ICD-10-CM | POA: Insufficient documentation

## 2024-07-26 DIAGNOSIS — M79671 Pain in right foot: Secondary | ICD-10-CM | POA: Diagnosis not present

## 2024-07-26 MED ORDER — TRIAMCINOLONE ACETONIDE 32 MG IX SRER
32.0000 mg | Freq: Once | INTRA_ARTICULAR | Status: AC
  Administered 2024-07-27: 32 mg via INTRA_ARTICULAR

## 2024-07-26 MED ORDER — OXYCODONE-ACETAMINOPHEN 10-325 MG PO TABS: 1.0000 | ORAL_TABLET | Freq: Four times a day (QID) | ORAL | 0 refills | Status: AC | PRN

## 2024-07-26 NOTE — Progress Notes (Addendum)
 "  Subjective:    Patient ID: Barry Beltran, male    DOB: April 20, 1976, 49 y.o.   MRN: [id number]  HPI  HPI   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  Patient reports much of his pain is due to his prior surgeries.  Patient reports he had an ACL injury of his right knee which required surgery in 2015.  He then had a fusion of his right foot/ankle in 2016 that required repeat surgery in 2017.  He then had bone spurs removed from his right foot in 2017.  Reports he developed a required ankle replacement surgery in 2018 and then a Hallux valgus surgery March 2023.  Patient reports he developed left hip pain requiring replacement in 2021 and later right hip pain that was replaced shortly after.  He had a right hip dislocation and required an additional surgery for this.  [name] reports that his greatest pain in his right ankle and right hip.  He also has back pain due to herniated disks and reports a history of 18 epidural injections.  He reports occasional shooting pain down his left lower extremity.  Pain is constant sharp and burning.  He has had multiple rounds of physical therapy with some improvement.  To help relieve his pain he uses Tylenol  1 g 3 times a day.  He also takes meloxicam  15 mg, and gabapentin  300 mg 3 times daily.  Patient is also been getting oxycodone  and occasionally tramadol.  He takes oxycodone  about 5 mg 4-5 times a day and this helps to control his pain.  He reports this allows him to be more active at home and to walk much further.  Gabapentin  dose is limited by sedation at higher doses.  He also wears a brace on his right foot often   Visit 05/31/22 [name] is here for follow-up of his chronic pain.  Pain continues to be worse in his right ankle.  Walking  continues to worsen the pain.  He continues to also have pain in his lower back will sometimes shoot down his left leg.  He also has pain in his hip on the right.  Patient reports that oxycodone  5 mg 6 times a day is keeping the pain under fairly good control.  And is allowing him to spend time with his family and be more active at home.  He says his pain did get worse a few weeks ago when he stopped using the THC Gummies.  He is not having any side effects the oxycodone .       Visit 05/29/2019 [name] is here for follow-up of his chronic pain.  He continues to have his worst pain in his right ankle.  This is worsened with ambulation.  He also is having pain in his lower back that shoots into his left thigh.  Oxycodone  5 mg 6 times a day as keeping the pain fairly well controlled.  He is not having any side effects with the medication.  He is not using THC Gummies anymore. Patient reports he is scheduled with [facility] to have repeat injections in his back.  He pulled he noted up on his phone where he had a  L4/5L 5/S1 medial brach block completed earlier this year.  He reports his procedure  helped the pain for over 6 months.  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    Interval history 07/27/2022 [name] is here for follow-up regarding his chronic pain.  He reports his foot and ankle pain has been worse recently.  His other areas of pain such as back and right hip are about the same.  He will occasionally have shooting pains on his left hip and thigh that worsens with activity.  He is not having this pain at this time.  He says he feels like his toenail on his right foot great toe is being squeezed or pulled off even though he no longer has the toenail here.  The oxycodone  continues to help however does not last as long as it previously did.  He is not having any side effects with this medication.  He reports he is still following with [facility] and is  considering repeat of the medial branch blocks.  He also reports they were talking to him about a possible implantable device in his back, spinal cord stimulator?  He reports pain has been limiting his activities and he is not able to do as much as he previously was able to.  He continues to take gabapentin  600 mg twice daily.  It is not causing him any significant sedation or other side effects.  Reports TENS unit did not help previously.     Interval history 09/28/22 [name] is here for follow-up regarding treatment of his chronic pain.  Patient reports since the increase gabapentin  the uncomfortable sensation around his skin has improved.  Oxycodone  continues to help him with his pain, however not quite as well as it did at the beginning.  Overall pain is controlled and he is able to function better with the medication.  Medication helps him spend better quality time with his family.  He is also taking several classes regarding his employment, and plans to travel to [place] shortly to take a class.  Patient reports he had a nerve ablation for his lower back completed, reports some improvement with this treatment.     Interval History 11/25/22 [name] is here for follow-up regarding his back hip and ankle pain.  Oxycodone  continues to keep his pain under control.  It is not as strong as it used to be however he does report that he is doing more than he used to.  He is able to spend time with his kids, exercise more, work on gun smithing and do more around the house.  Gabapentin  continues to help his nerve pain at his incision sites.  He denies any side effects with medications.  He has been having some increased pain in his left knee for the past several weeks.  Interval History 01/25/23 [name] is here for follow-up regarding his chronic pain.  He reports that his hip has been a little better recently.  He continues to have back pain and right ankle pain.  He has also been having a lot of pain in his  left knee.  He had a x-ray completed by Ortho over his left knee.  Patient uploaded images of this x-ray and it does appear to show OA.  He had cortisone injection completed by Ortho 12/03/2022.  The cortisone injection left knee helped for about a month but knee pain has returned.  Knee pain is worse with activity.  Although oxycodone  is not helping as much as it used to, he still feels  that it provides benefit and helps keep his pain tolerable.  He also feels that gabapentin  reducing his nerve pain, however pain continues to be uncomfortable at times.  No side effects of medications.  Interval History 04/22/23 [name] is here for follow-up regarding his chronic pain in multiple areas.  He reports lately his knee has been hurting a lot.  Pain relief with cortisone did not last long enough, he is interested in trying viscosupplementation.  Pain in other areas such as his right ankle and lower back is unchanged from prior visit.  He continues to use oxycodone  5 mg for pain control.  This provides benefit and does not cause any side effects.   Interval History 06/20/2023 [name] is here for follow-up regarding his chronic pain.  He reports he continues to have pain but current medications are keeping it under control.   No side effects with medications.He reports he is much more active with current regimen than he was previously.  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  His right foot pain has been worse recently due to the cold weather.  His left knee has not been bothering him as much.  He also decided not to do knee injection with gel due to the cost of this injection.   Interval History 10/14/23 Pain overall unchanged from last few visits. Pain improving a little with warmer weather. Oxycodone  5mg  helping keep pain controlled. Reports he had recent flu and family had covid, now doing better. NO side effects with the medications.  No new concerns.  Patient reports  he realized gabapentin  helping more then he previously realized.   Interval History 12/09/23 Pain overall controlled with current medication. Gabapentin  and oxycodone  keeping his pain tolerable. He remains active, working on [employer].  No side effects with the medication.  R ankle and foot tingling burning pain is his greatest area of pain. He has back pain more on the right side, left L spine pain improved with injections at [facility]. L knee pain is doing ok.  It sometimes hurts with activity at medial joint line.     Interval History 02/06/2024 Patient has been having increased pain in his right foot.  Patient reports this feels like nerve pain.  It is of stinging, numb, burning, tingling and shocking quality.  Pain is over his dorsal right foot and over his toes.  He reports its definitely nerve pain .  He reports gabapentin  was helping initially when he started it but he feels like it is not controlling the pain as well currently.  Oxycodone  as needed continues to help his overall pain and he is not having any side effects with this medication.  Interval History 04/09/24 Reports a trial of Lyrica  for over a month was ineffective for pain and worsened foot pain. Has since switched back to gabapentin , which is providing better relief, although not as effective as it used to be. Suggests a tolerance may have developed. Foot pain is described as a nerve-like pain, localized to the great toe and the surrounding bone.  Overall, no new issues. Reports life is good.  Knee pain is ongoing, described as sore. The area is swollen compared to the contralateral side. Last cortisone injection was on 01/23/2024 and provided good relief for about two weeks before symptoms returned.  - Gabapentin : Currently taking 800 mg three times daily. Reports it helps calm the pain down, but the effect is less  than it was previously. - Lyrica : Trialed for one month, discontinued due to inefficacy and worsening  foot and shoulder pain. - Oxycodone : Reports it is okay, but not as effective as it was 1.5 years ago. Not increased dose since starting. - Qutenza patch: Discussed for nerve pain, but insurance will not cover it. - Cymbalta (duloxetine): Previously tried and reported feeling unwell. Not interested in a retrial at this time.  Interval History 07/26/24 The patient reports worsening knee pain over the last 3 weeks, describing it as constant. He received a cortisone injection approximately one year ago, which provided benefit for only a few days. He has since experienced good and bad days. Continues to have pain in his ankle, back and hip. His current pain medication oxycodone  provides relief for only about 4 hours. No side effects.    Pain Inventory Average Pain 7 Pain Right Now 6  My pain is sharp, burning, stabbing, tingling, and aching  In the last 24 hours, has pain interfered with the following? General activity 2,3  Relation with others 0 Enjoyment of life 0 What TIME of day is your pain at its worst? morning  and evening Sleep (in general) Good  Pain is worse with: bending and some activites Pain improves with: heat/ice, pacing activities, medication, and injections Relief from Meds: 7  Family History  Problem Relation Age of Onset   Hypertension Mother    Social History   Socioeconomic History   Marital status: Married    Spouse name: Not on file   Number of children: Not on file   Years of education: Not on file   Highest education level: Not on file  Occupational History   Not on file  Tobacco Use   Smoking status: Every Day    Current packs/day: 0.80    Types: Cigarettes   Smokeless tobacco: Never  Vaping Use   Vaping status: Never Used  Substance and Sexual Activity   Alcohol use: Yes    Comment: rarely   Drug use: No   Sexual activity: Not on file  Other Topics Concern   Not on file  Social History Narrative   Not on file   Social Drivers of Health    Tobacco Use: High Risk (07/09/2024)   Patient History    Smoking Tobacco Use: Every Day    Smokeless Tobacco Use: Never    Passive Exposure: Not on file  Financial Resource Strain: Patient Declined (08/09/2022)   Received from [facility]   Overall Financial Resource Strain (CARDIA)    Difficulty of Paying Living Expenses: Patient declined  Food Insecurity: Low Risk (06/08/2024)   Received from [facility]   Epic    Within the past 12 months, the food you bought just didn't last and you didn't have money to get more. : Never true    Within the past 12 months, you worried that your food would run out before you got money to buy more: Never true  Transportation Needs: No Transportation Needs (06/08/2024)   Received from [facility]    In the past 12 months, has lack of reliable transportation kept you from medical appointments, meetings, work or from getting things needed for daily living? : No  Physical Activity: Sufficiently Active (08/09/2022)   Received from [facility]   Exercise Vital Sign    On average, how many days per week do you engage in moderate to strenuous exercise (like a brisk walk)?: 7 days  On average, how many minutes do you engage in exercise at this level?: 60 min  Stress: No Stress Concern Present (08/09/2022)   Received from [facility] - Occupational Stress Questionnaire    Feeling of Stress : Not at all  Recent Concern: Stress - Stress Concern Present (05/18/2022)   Received from [facility] of Occupational Health - Occupational Stress Questionnaire    Feeling of Stress : To some extent  Social Connections: Socially Integrated (08/09/2022)   Received from [facility]   Social Network    How would you rate your social network (family, work, friends)?: Good participation with social networks  Depression (PHQ2-9): Low Risk (07/09/2024)   Depression (PHQ2-9)    PHQ-2  Score: 0  Alcohol Screen: Not on file  Housing: Low Risk (06/08/2024)   Received from [facility]   Epic    What is your living situation today?: I have a steady place to live    Think about the place you live. Do you have problems with any of the following? Choose all that apply:: None/None on this list  Utilities: Low Risk (06/08/2024)   Received from [facility]   Utilities    In the past 12 months has the electric, gas, oil, or water company threatened to shut off services in your home? : No  Health Literacy: Not on file   No past surgical history on file. No past surgical history on file. Past Medical History:  Diagnosis Date   Hypertension    There were no vitals taken for this visit.  Opioid Risk Score:   Fall Risk Score:  `1  Depression screen [facility] 2/9     07/09/2024    8:46 AM 04/09/2024   10:07 AM 02/06/2024    9:39 AM 12/09/2023    9:24 AM 10/14/2023    9:43 AM 06/20/2023    1:02 PM 04/22/2023   11:14 AM  Depression screen PHQ 2/9  Decreased Interest 0 0 0 0 0 0 0  Down, Depressed, Hopeless 0 0 0 0 0 0 0  PHQ - 2 Score 0 0 0 0 0 0 0    Review of Systems  Constitutional: Negative.   HENT: Negative.    Eyes: Negative.   Respiratory: Negative.    Cardiovascular: Negative.   Gastrointestinal: Negative.   Endocrine: Negative.   Genitourinary: Negative.   Musculoskeletal:  Positive for back pain and gait problem.       Right foot pain, right hip pain, right knee pain  Skin: Negative.   Allergic/Immunologic: Negative.   Hematological: Negative.   Psychiatric/Behavioral: Negative.    All other systems reviewed and are negative.      Objective:   Physical Exam     07/09/2024    9:23 AM 07/09/2024    8:46 AM 04/09/2024   10:06 AM  Vitals with BMI  Height  5' 11   Weight  156 lbs   BMI  21.77   Systolic  131 164  Diastolic  91 94  Pulse 102 108      Gen: no distress, normal appearing HEENT: oral mucosa pink and moist, NCAT Chest: normal  effort, normal rate of breathing Abd: soft, non-distended Ext: no edema Psych: pleasant, normal affect Skin: intact Neuro: Alert and oriented x4, follows commands, cranial nerves II through XII grossly intact, Moving all 4 extremities to gravity and resistance  Musculoskeletal:   L-spine paraspinal tenderness b/l R>L  TTP right ankle and proximal foot, with altered sensation over his foot TTP left knee joint line   MRI L spine 09/17/21  FINDINGS: No significant interval change.   For the purposes of this dictation, it is assumed that the most caudal fully-segmented lumbar-type vertebra is L5, and that the most caudal fully-developed intervertebral disc is L5-S1.   Degenerative disc disease (DDD) and facet arthropathy (see below).   Vertebral alignment: Slight retrolisthesis of L3 on L4, as before.   Marrow signal: No significant abnormality.  Vertebral body heights: Normal.  Conus medullaris: Terminates at a normal level. Normal in size and signal intensity.   Individual disc levels:   T12-L1: Normal.  L1-2: Normal.  L2-3: Normal.   L3-4: Degenerative changes, including slight retrolisthesis and a small left foraminal zone disc-osteophyte complex. Mild left neural foraminal stenosis. No significant spinal canal or right neural foraminal stenosis. Appearance similar to prior.   L4-5: Mild degenerative changes, including a small right foraminal zone disc-osteophyte complex. No significant central zone stenosis. Mild bilateral subarticular zone stenosis. Mild right neural foraminal stenosis. No significant left neural foraminal stenosis. Appearance similar to prior.   L5-S1: Mild degenerative changes. Small left subarticular zone disc protrusion, as before, with impingement on the descending left S1 nerve root. Overall moderate left subarticular zone stenosis. No significant central zone stenosis. Mild right subarticular zone stenosis. No significant neural foraminal stenosis.    Xray R hip 08/05/21 Standing pelvis and right hip films taken and reviewed.  He has  well-fixed, well-positioned bilateral hip replacement implants, right side  has a dual mobility articulation.  No grossly complicating features    R foot 12/11/21             FINDINGS: Screws throughout all 5 digits traversing the interphalangeal joints. Some fusion of the first digit interphalangeal joint noted. Ankle joint arthroplasty. There is no evidence of fracture or dislocation. No cortical erosion or destruction. Soft tissues are unremarkable.   IMPRESSION: Screws throughout all 5 digits traversing the interphalangeal joints.     R ankle xray 05/08/2019 report appropriate maintenance of alignment and stable overall TAR construct, with no  evidence of hardware complication or prosthetic loosening.  Forefoot  reconstruction surgery is healing nicely with excellent alignment to the         Assessment & Plan:   Chronic ankle pain in s/p multiple ankle surgeries -Continue meloxicam  current dose -Continue Tylenol  as needed -UDS and pain contract completed prior visit -Increase Percocet 10 mg 4 times a day -Consider long acting medication, Butrans- although pt says he may have tried in past with suboptimal benefit -Continue gabapentin  1200mg  TID -Poor response to lyrica  in past -Cymbalta- and amitriptyline made him feel bad and  in the past.  -Mod opiod risk tool -Monitor UDS, pill counts, PDMP -Narcan  ordered for safety previously  -TENS unit did help -continue -Consider Qutenza for neuropathic pain dorsal right foot, likely just 1 patch --Discussed Qutenza as an option for neuropathic pain control. Pt concerned with cost of medication.   Chronic lower back pain with lumbar spondylosis and left-sided radicular pain -Interventional procedures for his back being done at [facility] - he gets Facet joint L spine nerve ablations.  -Continue treatment as above   Hip pain greater on the right  s/p bilateral hip replacements -Continue meloxicam    Left knee pain, suspect OA -Appears patient completed x-ray with Ortho, he did have a picture of this that he uploaded and it does appear to show  OA to the medial compartment -Knee joint viscosupplementation,  limited by cost    L knee injection   Indication:Knee pain not relieved by medication management and other conservative care.  Informed consent was obtained after describing risks and benefits of the procedure with the patient, this includes bleeding, bruising, infection and medication side effects. The patient wishes to proceed and has given written consent. The patient was placed in a recumbent position. The Lateral aspect of the knee was marked and prepped with Betadine and alcohol. It was then entered with a 21-gauge 1-1/2 inch needle.  After negative draw back for blood, a solution containing Zilretta  5 mL , 32 mg was injected. The patient tolerated the procedure well. Post procedure instructions were given.   HTN -F/u with PCP  "

## 2024-07-26 NOTE — Addendum Note (Signed)
 Addended by: URBANO ALBRIGHT on: 07/26/2024 08:10 PM   Modules accepted: Orders

## 2024-07-27 DIAGNOSIS — G894 Chronic pain syndrome: Secondary | ICD-10-CM | POA: Diagnosis not present

## 2024-07-31 ENCOUNTER — Encounter: Admitting: Physical Medicine & Rehabilitation

## 2024-08-21 ENCOUNTER — Encounter: Admitting: Physical Medicine & Rehabilitation

## 2024-09-25 ENCOUNTER — Encounter: Admitting: Physical Medicine & Rehabilitation
